# Patient Record
Sex: Female | Born: 1990 | Race: White | Hispanic: No | Marital: Single | Smoking: Never smoker
Health system: Southern US, Community
[De-identification: ages and names within clinical notes are randomized; demographics above are authoritative.]

## PROBLEM LIST (undated history)

## (undated) DIAGNOSIS — Z3490 Encounter for supervision of normal pregnancy, unspecified, unspecified trimester: Secondary | ICD-10-CM

## (undated) DIAGNOSIS — T7840XA Allergy, unspecified, initial encounter: Secondary | ICD-10-CM

## (undated) DIAGNOSIS — K5909 Other constipation: Secondary | ICD-10-CM

## (undated) DIAGNOSIS — K219 Gastro-esophageal reflux disease without esophagitis: Secondary | ICD-10-CM

## (undated) DIAGNOSIS — Z8619 Personal history of other infectious and parasitic diseases: Secondary | ICD-10-CM

## (undated) DIAGNOSIS — K649 Unspecified hemorrhoids: Secondary | ICD-10-CM

## (undated) DIAGNOSIS — I809 Phlebitis and thrombophlebitis of unspecified site: Secondary | ICD-10-CM

## (undated) DIAGNOSIS — R05 Cough: Secondary | ICD-10-CM

## (undated) HISTORY — PX: WISDOM TOOTH EXTRACTION: SHX21

## (undated) HISTORY — DX: Gastro-esophageal reflux disease without esophagitis: K21.9

## (undated) HISTORY — DX: Allergy, unspecified, initial encounter: T78.40XA

## (undated) HISTORY — DX: Phlebitis and thrombophlebitis of unspecified site: I80.9

## (undated) HISTORY — PX: MOUTH SURGERY: SHX715

## (undated) HISTORY — DX: Unspecified hemorrhoids: K64.9

## (undated) HISTORY — DX: Personal history of other infectious and parasitic diseases: Z86.19

## (undated) HISTORY — DX: Other constipation: K59.09

---
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
## 2012-09-08 ENCOUNTER — Ambulatory Visit (INDEPENDENT_AMBULATORY_CARE_PROVIDER_SITE_OTHER): Payer: PRIVATE HEALTH INSURANCE | Admitting: Family Medicine

## 2012-09-08 ENCOUNTER — Encounter: Payer: Self-pay | Admitting: Family Medicine

## 2012-09-08 VITALS — BP 110/60 | HR 98 | Temp 98.5°F | Ht 62.5 in | Wt 132.8 lb

## 2012-09-08 DIAGNOSIS — Z0000 Encounter for general adult medical examination without abnormal findings: Secondary | ICD-10-CM

## 2012-09-08 DIAGNOSIS — Z113 Encounter for screening for infections with a predominantly sexual mode of transmission: Secondary | ICD-10-CM

## 2012-09-08 NOTE — Progress Notes (Signed)
The patient is here for annual wellness exam and preventative care.    Birth control - Not using any birth control at this time.  She felt that trisprintec was difficult to take and patch  Made her depressed. She is not currently interested in birth control.  LMP - 3 wks ago, regular.  4-5d period, not heavy bleeding.  No breakthrough bleeding between periods.  No significant cramping/abd pain.  Takes midol and this works.    Currently sexually active.  3 partners in last year.  Chlamydia 1 year ago. She is interested in STD testing this year.  Condoms 100% time.  Preventative: Seat belt use 100%.  Sunscreen use discussed. Tetanus - 06/2010 prior to trip to [place].    Thinks has had guardasil series. Had chicken pox at age 54 yo.  History   Social History  . Marital Status: Single    Spouse Name: N/A    Number of Children: N/A  . Years of Education: N/A   Social History Main Topics  . Smoking status: Never Smoker   . Smokeless tobacco: Never Used  . Alcohol Use: 0.6 oz/week    1 Glasses of wine per week     Comment: every few weeks  . Drug Use: No  . Sexually Active: Yes    Birth Control/ Protection: Condom   Other Topics Concern  . None   Social History Narrative   Exercising 4 days a week.   Healthy eating.          Review of Systems  Constitutional: Negative for fever, chills, activity change, appetite change, fatigue and unexpected weight change.  HENT: Negative for hearing loss and neck pain.   Eyes: Negative for visual disturbance.  Respiratory: Negative for cough, chest tightness, shortness of breath and wheezing.   Cardiovascular: Negative for chest pain, palpitations and leg swelling.  Gastrointestinal: Negative for nausea, vomiting, abdominal pain, diarrhea, constipation, blood in stool and abdominal distention.  Genitourinary: Negative for hematuria, vaginal bleeding, vaginal discharge and difficulty urinating.  Musculoskeletal: Negative for myalgias  and arthralgias.  Skin: Negative for rash.  Neurological: Negative for dizziness, seizures, syncope and headaches.  Hematological: Does not bruise/bleed easily.  Psychiatric/Behavioral: Negative for dysphoric mood. The patient is not nervous/anxious.          Objective:     Physical Exam  Nursing note and vitals reviewed. Constitutional: She is oriented to person, place, and time. She appears well-developed and well-nourished. No distress.  HENT:   Head: Normocephalic and atraumatic.  Right Ear: External ear normal.  Left Ear: External ear normal.   Nose: Nose normal.   Mouth/Throat: Oropharynx is clear and moist. No oropharyngeal exudate.  Eyes: Conjunctivae and EOM are normal. Pupils are equal, round, and reactive to light. No scleral icterus.  Neck: Normal range of motion. Neck supple. No thyromegaly present.  Cardiovascular: Normal rate, regular rhythm, normal heart sounds and intact distal pulses.    No murmur heard. Pulses:      Radial pulses are 2+ on the right side, and 2+ on the left side.  Pulmonary/Chest: Effort normal and breath sounds normal. No respiratory distress. She has no wheezes. She has no rales.  Abdominal: Soft. Bowel sounds are normal. She exhibits no distension and no mass. There is no tenderness. There is no rebound and no guarding. Hernia confirmed negative in the right inguinal area and confirmed negative in the left inguinal area.  Genitourinary: Uterus normal. There is no rash, tenderness, lesion or  injury on the right labia. There is no rash, tenderness, lesion or injury on the left labia. Cervix exhibits no motion tenderness, no discharge and no friability. Right adnexum displays no mass, no tenderness and no fullness. Left adnexum displays no mass, no tenderness and no fullness. No erythema or tenderness around the vagina. No foreign body around the vagina. No signs of injury around the vagina. Vaginal discharge found.       Pap smear not performed   Musculoskeletal: Normal range of motion.  Lymphadenopathy:    She has no cervical adenopathy.  Neurological: She is alert and oriented to person, place, and time.       CN grossly intact, station and gait intact  Skin: Skin is warm and dry. No rash noted.  Psychiatric: She has a normal mood and affect. Her behavior is normal. Judgment and thought content normal.        Assessment & Plan:      Preventative protocols reviewed and updated. Per pt has had guardasil. UTD tetanus (2012). STD screen today.  Discussed safe sex, self breast exams, as well as performed pelvic today.

## 2012-09-08 NOTE — Assessment & Plan Note (Signed)
The patient's preventative maintenance and recommended screening tests for an annual wellness exam were reviewed in full today. Brought up to date unless services declined.  Counselled on the importance of diet, exercise, and its role in overall health and mortality. The patient's FH and SH was reviewed, including their home life, tobacco status, and drug and alcohol status.    

## 2012-09-08 NOTE — Patient Instructions (Addendum)
Stop at lab on your way out. Keep up great work with exercise and healthy eating.

## 2012-09-09 LAB — GC/CHLAMYDIA PROBE AMP
CT Probe RNA: POSITIVE — AB
GC Probe RNA: NEGATIVE

## 2012-09-09 LAB — HEPATITIS PANEL, ACUTE
HCV Ab: NEGATIVE
Hep A IgM: NEGATIVE
Hepatitis B Surface Ag: NEGATIVE

## 2012-09-09 LAB — RPR

## 2012-09-14 MED ORDER — DOXYCYCLINE HYCLATE 100 MG PO TABS: 100.0000 mg | ORAL_TABLET | Freq: Two times a day (BID) | ORAL | Status: DC

## 2012-09-14 NOTE — Addendum Note (Signed)
Addended by: Consuello Masse on: 09/14/2012 08:07 AM   Modules accepted: Orders

## 2012-09-24 ENCOUNTER — Telehealth: Payer: Self-pay

## 2012-09-24 NOTE — Telephone Encounter (Signed)
Pt said when taking Doxycycline without food made her nauseated but when she took with food did not cause nausea. Pt has already finished med. Pt wants to know if one prescription of Doxycycline will take care of Chlamydia. Pt does not have any discharge or itching.Please advise. Pt request call back by doctor if possible. [facility].

## 2012-09-25 NOTE — Telephone Encounter (Signed)
Patient advised.

## 2012-09-25 NOTE — Telephone Encounter (Signed)
Yes the doxy will treat chlamydia with 10 day course as long as she did not have sex with infected individaul duing course of treatment.

## 2012-10-13 ENCOUNTER — Encounter: Payer: Self-pay | Admitting: Family Medicine

## 2012-10-13 ENCOUNTER — Ambulatory Visit (INDEPENDENT_AMBULATORY_CARE_PROVIDER_SITE_OTHER): Payer: PRIVATE HEALTH INSURANCE | Admitting: Family Medicine

## 2012-10-13 VITALS — BP 100/60 | HR 87 | Temp 98.2°F | Ht 62.5 in | Wt 130.5 lb

## 2012-10-13 DIAGNOSIS — A568 Sexually transmitted chlamydial infection of other sites: Secondary | ICD-10-CM

## 2012-10-13 DIAGNOSIS — Z3009 Encounter for other general counseling and advice on contraception: Secondary | ICD-10-CM

## 2012-10-13 NOTE — Patient Instructions (Addendum)
Return for UA and depo injection in 2 weeks.  We will call with UA and lab results.

## 2012-10-13 NOTE — Addendum Note (Signed)
Addended by: Consuello Masse on: 10/13/2012 12:12 PM   Modules accepted: Orders

## 2012-10-13 NOTE — Assessment & Plan Note (Addendum)
Reviewed options ,SE and answered pt questions. Chose  Depo Provera

## 2012-10-13 NOTE — Progress Notes (Signed)
  Subjective:    Patient ID: Julia Lyons, female    DOB: 09/19/1990, 22 y.o.   MRN: [id number]  HPI  22 year old female recently seen for CPX with STD screen found to be positive for chlamydia presents for follow up. She was treated with 10 day course of doxycycline and took all of this except the first day.. She threw up one dose. She has also had a different partner lately and condom broke in last 3 weeks. Took the  "day after pill" She did not tell any of her partners that she had been diagnosed.  She wishes to be retested for chlamydia to ensure resolution of infection.   She is interested in birth control options. She has tried patch and OCPs. She felt depressed on patch.  Last menses 2-3 weeks ago.     Review of Systems  Constitutional: Negative for fever and fatigue.  HENT: Negative for ear pain.   Eyes: Negative for pain.  Respiratory: Negative for chest tightness and shortness of breath.   Cardiovascular: Negative for chest pain, palpitations and leg swelling.  Gastrointestinal: Negative for abdominal pain.  Genitourinary: Negative for dysuria, vaginal bleeding and vaginal discharge.       Objective:   Physical Exam  Constitutional: She appears well-developed and well-nourished.  Cardiovascular: Normal rate and regular rhythm.  Exam reveals no friction rub.   No murmur heard. Pulmonary/Chest: Effort normal and breath sounds normal. No respiratory distress. She has no wheezes. She has no rales. She exhibits no tenderness.  Abdominal: Soft. There is no tenderness. There is no rebound and no guarding.  Genitourinary: Uterus normal. There is no rash or lesion on the right labia. There is no rash or lesion on the left labia. Cervix exhibits no motion tenderness, no discharge and no friability. No tenderness or bleeding around the vagina. Vaginal discharge found.          Assessment & Plan:
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
## 2012-10-14 LAB — GC/CHLAMYDIA PROBE AMP: GC Probe RNA: NEGATIVE

## 2012-10-27 ENCOUNTER — Ambulatory Visit: Payer: PRIVATE HEALTH INSURANCE | Admitting: Family Medicine

## 2012-10-28 ENCOUNTER — Ambulatory Visit (INDEPENDENT_AMBULATORY_CARE_PROVIDER_SITE_OTHER): Payer: PRIVATE HEALTH INSURANCE | Admitting: *Deleted

## 2012-10-28 DIAGNOSIS — Z309 Encounter for contraceptive management, unspecified: Secondary | ICD-10-CM

## 2012-10-28 LAB — POCT URINE PREGNANCY: Preg Test, Ur: NEGATIVE

## 2012-10-28 MED ORDER — MEDROXYPROGESTERONE ACETATE 150 MG/ML IM SUSP
150.0000 mg | Freq: Once | INTRAMUSCULAR | Status: AC
  Administered 2012-10-28: 150 mg via INTRAMUSCULAR

## 2013-05-27 NOTE — L&D Delivery Note (Signed)
Delivery Note At 7:40 PM a viable female "Julia Lyons" was delivered via [place] (Presentation: Occiput Anterior).  APGARS: 9, 9; weight 8lbs 1 oz (3668 g).   Placenta status: Intact, Spontaneous.  Cord: 3 vessels with the following complications: None.  Cord pH: NA  Anesthesia: Local for repair Episiotomy: None Lacerations: Bilateral labial lacs Suture Repair: 3.0 vicryl SH Est. Blood Loss (mL): 250  Mom to postpartum.  Baby to Couplet care / Skin to Skin.  Mom plans to breastfeed.  Undecided re: birth control.  Windy Kalata, CNM present for birth.  Farrel Gordon 05/23/2014, 8:48 PM

## 2013-09-29 LAB — OB RESULTS CONSOLE ABO/RH: RH Type: POSITIVE

## 2013-09-29 LAB — OB RESULTS CONSOLE GC/CHLAMYDIA
CHLAMYDIA, DNA PROBE: NEGATIVE
GC PROBE AMP, GENITAL: NEGATIVE

## 2013-09-29 LAB — OB RESULTS CONSOLE RUBELLA ANTIBODY, IGM: Rubella: IMMUNE

## 2013-09-29 LAB — OB RESULTS CONSOLE ANTIBODY SCREEN: Antibody Screen: NEGATIVE

## 2013-09-29 LAB — OB RESULTS CONSOLE HEPATITIS B SURFACE ANTIGEN: Hepatitis B Surface Ag: NEGATIVE

## 2013-09-29 LAB — OB RESULTS CONSOLE HIV ANTIBODY (ROUTINE TESTING): HIV: NONREACTIVE

## 2013-09-29 LAB — OB RESULTS CONSOLE RPR: RPR: NONREACTIVE

## 2014-04-19 LAB — OB RESULTS CONSOLE GBS: GBS: POSITIVE

## 2014-05-19 ENCOUNTER — Emergency Department (HOSPITAL_COMMUNITY): Payer: PRIVATE HEALTH INSURANCE

## 2014-05-19 ENCOUNTER — Encounter (HOSPITAL_COMMUNITY): Payer: Self-pay

## 2014-05-19 ENCOUNTER — Observation Stay (HOSPITAL_COMMUNITY)
Admission: EM | Admit: 2014-05-19 | Discharge: 2014-05-20 | Disposition: A | Discharge status: Home / Self Care | Payer: PRIVATE HEALTH INSURANCE | Attending: Obstetrics and Gynecology | Admitting: Obstetrics and Gynecology

## 2014-05-19 DIAGNOSIS — K219 Gastro-esophageal reflux disease without esophagitis: Secondary | ICD-10-CM | POA: Diagnosis not present

## 2014-05-19 DIAGNOSIS — O26893 Other specified pregnancy related conditions, third trimester: Principal | ICD-10-CM | POA: Insufficient documentation

## 2014-05-19 DIAGNOSIS — Y92410 Unspecified street and highway as the place of occurrence of the external cause: Secondary | ICD-10-CM | POA: Diagnosis not present

## 2014-05-19 DIAGNOSIS — J45990 Exercise induced bronchospasm: Secondary | ICD-10-CM | POA: Diagnosis not present

## 2014-05-19 DIAGNOSIS — R102 Pelvic and perineal pain: Secondary | ICD-10-CM | POA: Insufficient documentation

## 2014-05-19 DIAGNOSIS — Z3A40 40 weeks gestation of pregnancy: Secondary | ICD-10-CM | POA: Insufficient documentation

## 2014-05-19 MED ORDER — CALCIUM CARBONATE ANTACID 500 MG PO CHEW: 2.0000 | CHEWABLE_TABLET | ORAL | Status: DC | PRN

## 2014-05-19 MED ORDER — PRENATAL MULTIVITAMIN CH: 1.0000 | ORAL_TABLET | Freq: Every day | ORAL | Status: DC

## 2014-05-19 MED ORDER — ACETAMINOPHEN 325 MG PO TABS
650.0000 mg | ORAL_TABLET | ORAL | Status: DC | PRN
  Administered 2014-05-19 – 2014-05-20 (×2): 650 mg via ORAL
  Filled 2014-05-19 (×2): qty 2

## 2014-05-19 MED ORDER — DOCUSATE SODIUM 100 MG PO CAPS: 100.0000 mg | ORAL_CAPSULE | Freq: Every day | ORAL | Status: DC

## 2014-05-19 MED ORDER — ZOLPIDEM TARTRATE 5 MG PO TABS: 5.0000 mg | ORAL_TABLET | Freq: Every evening | ORAL | Status: DC | PRN

## 2014-05-19 NOTE — ED Notes (Signed)
OB rapid response nurse at bedside and monitoring patient and baby
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
## 2014-05-20 DIAGNOSIS — O26893 Other specified pregnancy related conditions, third trimester: Secondary | ICD-10-CM | POA: Diagnosis not present

## 2014-05-20 LAB — CBC
HCT: 35.1 % — ABNORMAL LOW (ref 36.0–46.0)
Hemoglobin: 11.6 g/dL — ABNORMAL LOW (ref 12.0–15.0)
MCH: 27.6 pg (ref 26.0–34.0)
MCHC: 33 g/dL (ref 30.0–36.0)
MCV: 83.4 fL (ref 78.0–100.0)
Platelets: 293 10*3/uL (ref 150–400)
RBC: 4.21 MIL/uL (ref 3.87–5.11)
RDW: 14.5 % (ref 11.5–15.5)
WBC: 15.3 10*3/uL — ABNORMAL HIGH (ref 4.0–10.5)

## 2014-05-20 LAB — ABO/RH: ABO/RH(D): O POS

## 2014-05-20 LAB — TYPE AND SCREEN
ABO/RH(D): O POS
Antibody Screen: NEGATIVE

## 2014-05-20 LAB — HIV ANTIBODY (ROUTINE TESTING W REFLEX): HIV: NONREACTIVE

## 2014-05-20 LAB — RPR

## 2014-05-20 MED ORDER — LACTATED RINGERS IV SOLN
INTRAVENOUS | Status: DC
  Administered 2014-05-20: 03:00:00 via INTRAVENOUS

## 2014-05-20 MED ORDER — LACTATED RINGERS IV BOLUS (SEPSIS)
500.0000 mL | Freq: Once | INTRAVENOUS | Status: AC
  Administered 2014-05-20: 500 mL via INTRAVENOUS

## 2014-05-20 NOTE — Discharge Instructions (Signed)
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
## 2014-05-23 ENCOUNTER — Inpatient Hospital Stay (HOSPITAL_COMMUNITY)
Admission: AD | Admit: 2014-05-23 | Discharge: 2014-05-25 | DRG: 775 | Disposition: A | Discharge status: Home / Self Care | Payer: Medicaid Other | Source: Ambulatory Visit | Attending: Obstetrics & Gynecology | Admitting: Obstetrics & Gynecology

## 2014-05-23 ENCOUNTER — Encounter (HOSPITAL_COMMUNITY): Payer: Self-pay | Admitting: *Deleted

## 2014-05-23 DIAGNOSIS — Z3A40 40 weeks gestation of pregnancy: Secondary | ICD-10-CM | POA: Diagnosis present

## 2014-05-23 DIAGNOSIS — K219 Gastro-esophageal reflux disease without esophagitis: Secondary | ICD-10-CM | POA: Diagnosis present

## 2014-05-23 DIAGNOSIS — O99824 Streptococcus B carrier state complicating childbirth: Principal | ICD-10-CM | POA: Diagnosis present

## 2014-05-23 DIAGNOSIS — Z3400 Encounter for supervision of normal first pregnancy, unspecified trimester: Secondary | ICD-10-CM | POA: Diagnosis present

## 2014-05-23 DIAGNOSIS — J45909 Unspecified asthma, uncomplicated: Secondary | ICD-10-CM | POA: Diagnosis present

## 2014-05-23 DIAGNOSIS — B951 Streptococcus, group B, as the cause of diseases classified elsewhere: Secondary | ICD-10-CM | POA: Diagnosis present

## 2014-05-23 HISTORY — DX: Cough: R05

## 2014-05-23 LAB — CBC
HEMATOCRIT: 37 % (ref 36.0–46.0)
Hemoglobin: 12.2 g/dL (ref 12.0–15.0)
MCH: 27.1 pg (ref 26.0–34.0)
MCHC: 33 g/dL (ref 30.0–36.0)
MCV: 82 fL (ref 78.0–100.0)
PLATELETS: 318 10*3/uL (ref 150–400)
RBC: 4.51 MIL/uL (ref 3.87–5.11)
RDW: 15.2 % (ref 11.5–15.5)
WBC: 14.2 10*3/uL — ABNORMAL HIGH (ref 4.0–10.5)

## 2014-05-23 LAB — TYPE AND SCREEN
ABO/RH(D): O POS
ANTIBODY SCREEN: NEGATIVE

## 2014-05-23 MED ORDER — LACTATED RINGERS IV SOLN: 500.0000 mL | INTRAVENOUS | Status: DC | PRN

## 2014-05-23 MED ORDER — ONDANSETRON HCL 4 MG PO TABS
4.0000 mg | ORAL_TABLET | ORAL | Status: DC | PRN
Start: 2014-05-23 — End: 2014-05-25

## 2014-05-23 MED ORDER — ACETAMINOPHEN 325 MG PO TABS: 650.0000 mg | ORAL_TABLET | ORAL | Status: DC | PRN

## 2014-05-23 MED ORDER — ONDANSETRON HCL 4 MG/2ML IJ SOLN: 4.0000 mg | Freq: Four times a day (QID) | INTRAMUSCULAR | Status: DC | PRN

## 2014-05-23 MED ORDER — LIDOCAINE HCL (PF) 1 % IJ SOLN
30.0000 mL | INTRAMUSCULAR | Status: AC | PRN
  Administered 2014-05-23: 30 mL via SUBCUTANEOUS
  Filled 2014-05-23: qty 30

## 2014-05-23 MED ORDER — IBUPROFEN 600 MG PO TABS
600.0000 mg | ORAL_TABLET | Freq: Four times a day (QID) | ORAL | Status: DC
  Administered 2014-05-23 – 2014-05-25 (×8): 600 mg via ORAL
  Filled 2014-05-23 (×8): qty 1

## 2014-05-23 MED ORDER — WITCH HAZEL-GLYCERIN EX PADS: 1.0000 "application " | MEDICATED_PAD | CUTANEOUS | Status: DC | PRN

## 2014-05-23 MED ORDER — OXYCODONE-ACETAMINOPHEN 5-325 MG PO TABS: 1.0000 | ORAL_TABLET | ORAL | Status: DC | PRN

## 2014-05-23 MED ORDER — SIMETHICONE 80 MG PO CHEW: 80.0000 mg | CHEWABLE_TABLET | ORAL | Status: DC | PRN

## 2014-05-23 MED ORDER — OXYTOCIN BOLUS FROM INFUSION
500.0000 mL | INTRAVENOUS | Status: DC
  Administered 2014-05-23: 500 mL via INTRAVENOUS

## 2014-05-23 MED ORDER — PENICILLIN G POTASSIUM 5000000 UNITS IJ SOLR
2.5000 10*6.[IU] | INTRAVENOUS | Status: DC
  Filled 2014-05-23 (×3): qty 2.5

## 2014-05-23 MED ORDER — CITRIC ACID-SODIUM CITRATE 334-500 MG/5ML PO SOLN: 30.0000 mL | ORAL | Status: DC | PRN

## 2014-05-23 MED ORDER — DIPHENHYDRAMINE HCL 25 MG PO CAPS: 25.0000 mg | ORAL_CAPSULE | Freq: Four times a day (QID) | ORAL | Status: DC | PRN

## 2014-05-23 MED ORDER — LANOLIN HYDROUS EX OINT: TOPICAL_OINTMENT | CUTANEOUS | Status: DC | PRN

## 2014-05-23 MED ORDER — ONDANSETRON HCL 4 MG/2ML IJ SOLN: 4.0000 mg | INTRAMUSCULAR | Status: DC | PRN

## 2014-05-23 MED ORDER — ZOLPIDEM TARTRATE 5 MG PO TABS: 5.0000 mg | ORAL_TABLET | Freq: Every evening | ORAL | Status: DC | PRN

## 2014-05-23 MED ORDER — DIBUCAINE 1 % RE OINT: 1.0000 "application " | TOPICAL_OINTMENT | RECTAL | Status: DC | PRN

## 2014-05-23 MED ORDER — OXYTOCIN 40 UNITS IN LACTATED RINGERS INFUSION - SIMPLE MED
62.5000 mL/h | INTRAVENOUS | Status: DC
  Filled 2014-05-23: qty 1000

## 2014-05-23 MED ORDER — LACTATED RINGERS IV SOLN
INTRAVENOUS | Status: DC
Start: 2014-05-23 — End: 2014-05-23
  Administered 2014-05-23: 16:00:00 via INTRAVENOUS

## 2014-05-23 MED ORDER — FLEET ENEMA 7-19 GM/118ML RE ENEM: 1.0000 | ENEMA | RECTAL | Status: DC | PRN

## 2014-05-23 MED ORDER — PENICILLIN G POTASSIUM 5000000 UNITS IJ SOLR
5.0000 10*6.[IU] | Freq: Once | INTRAVENOUS | Status: AC
  Administered 2014-05-23: 5 10*6.[IU] via INTRAVENOUS
  Filled 2014-05-23: qty 5

## 2014-05-23 MED ORDER — TETANUS-DIPHTH-ACELL PERTUSSIS 5-2.5-18.5 LF-MCG/0.5 IM SUSP: 0.5000 mL | Freq: Once | INTRAMUSCULAR | Status: DC

## 2014-05-23 MED ORDER — PRENATAL MULTIVITAMIN CH
1.0000 | ORAL_TABLET | Freq: Every day | ORAL | Status: DC
  Filled 2014-05-23 (×2): qty 1

## 2014-05-23 MED ORDER — LACTATED RINGERS IV SOLN: INTRAVENOUS | Status: DC

## 2014-05-23 MED ORDER — OXYCODONE-ACETAMINOPHEN 5-325 MG PO TABS: 2.0000 | ORAL_TABLET | ORAL | Status: DC | PRN

## 2014-05-23 MED ORDER — BENZOCAINE-MENTHOL 20-0.5 % EX AERO
1.0000 "application " | INHALATION_SPRAY | CUTANEOUS | Status: DC | PRN
  Administered 2014-05-24: 1 via TOPICAL
  Filled 2014-05-23: qty 56

## 2014-05-23 MED ORDER — SENNOSIDES-DOCUSATE SODIUM 8.6-50 MG PO TABS
2.0000 | ORAL_TABLET | ORAL | Status: DC
  Administered 2014-05-23 – 2014-05-25 (×2): 2 via ORAL
  Filled 2014-05-23 (×2): qty 2

## 2014-05-23 MED ORDER — BUTORPHANOL TARTRATE 1 MG/ML IJ SOLN: 1.0000 mg | INTRAMUSCULAR | Status: DC | PRN

## 2014-05-23 MED ORDER — OXYCODONE-ACETAMINOPHEN 5-325 MG PO TABS
2.0000 | ORAL_TABLET | ORAL | Status: DC | PRN
Start: 2014-05-23 — End: 2014-05-23

## 2014-05-23 NOTE — Progress Notes (Signed)
  Subjective: Assumed care of pt. Breathing through ctxs. Family and doula at bedside. Tub not yet set up.  Objective: BP 119/78 mmHg  Pulse 87  Temp(Src) 98.3 F (36.8 C) (Oral)  Resp 18  Ht 5\' 1"  (1.549 m)  Wt 141 lb (63.957 kg)  BMI 26.66 kg/m2     FHT: Category 1 UC:   irregular, every 4 minutes SVE: Deferred  Assessment:  IUP at 40.3 wks Early labor WB candidate GBS positive  Plan: Expectant management Continue GBS prophylaxis Consult prn V. Cira Servant, CNM proctor until 8:00 PM. Will contact [facility] on call CNM after 8:00 PM to apprise of pt's status and for needed proctor coverage  Farrel Gordon CNM 05/23/2014, 6:13 PM

## 2014-05-23 NOTE — Progress Notes (Signed)
Waterbirth consent in chart
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
## 2014-05-23 NOTE — Progress Notes (Signed)
Late entry progress note.  Pt began feeling pressure around 6:30 pm and opted to get in tub. While in tub, reassuring FHR with ctxs q 1-2 min. Cvx C/C/+2. Windy Kalata, CNM notified and remained in hospital for imminent delivery.  Farrel Gordon, CNM 05/23/14, 8:58 PM

## 2014-05-23 NOTE — MAU Note (Signed)
Regular since 040000, now every 5 minx .scant amt of bleed. No leaking. No problems with preg.

## 2014-05-23 NOTE — Progress Notes (Signed)
Encouraged pt multiple times to get up to walk, sit on birthing ball, or any activity she felt would be helpful with her pain. Pt remains in the bed.

## 2014-05-24 LAB — CBC
HEMATOCRIT: 33.1 % — AB (ref 36.0–46.0)
HEMOGLOBIN: 10.8 g/dL — AB (ref 12.0–15.0)
MCH: 27.2 pg (ref 26.0–34.0)
MCHC: 32.6 g/dL (ref 30.0–36.0)
MCV: 83.4 fL (ref 78.0–100.0)
Platelets: 279 10*3/uL (ref 150–400)
RBC: 3.97 MIL/uL (ref 3.87–5.11)
RDW: 14.9 % (ref 11.5–15.5)
WBC: 19.5 10*3/uL — AB (ref 4.0–10.5)

## 2014-05-24 LAB — RPR

## 2014-05-24 NOTE — Progress Notes (Signed)
Julia Lyons  Post Partum Day 1:S/P SVD in water with Bilateral Labial Lacerations  Subjective: Patient up ad lib, denies syncope or dizziness. Reports consuming regular diet without issues and denies N/V. Denies issues with urination, but admits to burning and reports bleeding is "stable, but consistent."  Patient is breastfeeding and reports going well.  Unsure of postpartum contraception method.  Managing pain with ibuprofen.   Objective: Filed Vitals:   05/23/14 2356 05/24/14 0333 05/24/14 0626 05/24/14 0846  BP: 100/64 106/65 113/73 106/67  Pulse: 96 86 87 76  Temp: 98.3 F (36.8 C) 98 F (36.7 C) 98.6 F (37 C) 98.3 F (36.8 C)  TempSrc: Oral Oral Oral Oral  Resp: 18 18 18 18   Height:      Weight:      SpO2:    98%    Recent Labs  05/23/14 1610 05/24/14 0610  HGB 12.2 10.8*  HCT 37.0 33.1*    Physical Exam:  General appearance: alert, cooperative and no distress Abdomen: normal findings: soft, non-tender Pulses: 2+ and symmetric Skin: Skin color, texture, turgor normal. No rashes or lesions Lochia: Small Laceration: N/A due to location Uterine Fundus: firm at U/-1 DVT Evaluation: No evidence of DVT seen on physical exam. No cords or calf tenderness.  Assessment S/P Vaginal Delivery-Day 1 Normal [employer]olution Breastfeeding Unsure of BCM Options  Plan:  Discussed various BCM that are safe during breastfeeding Will provide additional information at discharge Continue current care as ordered Dr.A. Mancel Bale to be updated on patient status  Plan for discharge tomorrow  Julia Lyons, Julia Found, MSN, CNM 05/24/2014, 1:41 PM

## 2014-05-24 NOTE — Lactation Note (Signed)
This note was copied from the chart of Julia Lyons. Lactation Consultation Note Initial visit at 28 hours of age.  Mom reports baby is doing well with feedings.  Baby is held in blankets now and showing feeding cues.  Encouraged mom to have visitors to step out so she can feed baby on demand.  Assisted with STS in cross cradle hold.  Mom reports she took a breastfeeding class with WIC.  Minimal assistance needed to position baby and latch.  Baby has wide open mouth, encouraged mom to compress breast to assist with latch.  Baby has wide flanged lips with rhythmic sucking and few swallows.  [facility] LC resources given and discussed.  Encouraged to feed with early cues on demand.  Early newborn behavior discussed.  Hand expression demonstrated with colostrum visible.  Mom to call for assist as needed.    Patient Name: Julia Lyons ALPFX'T Date: 05/24/2014 Reason for consult: Initial assessment   Maternal Data Has patient been taught Hand Expression?: Yes Does the patient have breastfeeding experience prior to this delivery?: No  Feeding Feeding Type: Breast Fed Length of feed:  (observed several minutes)  LATCH Score/Interventions Latch: Grasps breast easily, tongue down, lips flanged, rhythmical sucking.  Audible Swallowing: A few with stimulation  Type of Nipple: Everted at rest and after stimulation  Comfort (Breast/Nipple): Soft / non-tender     Hold (Positioning): Assistance needed to correctly position infant at breast and maintain latch. Intervention(s): Breastfeeding basics reviewed;Support Pillows;Position options;Skin to skin  LATCH Score: 8  Lactation Tools Discussed/Used     Consult Status Consult Status: Follow-up Date: 05/25/14 Follow-up type: In-patient    Justice Britain 05/24/2014, 5:47 PM

## 2014-05-25 MED ORDER — DIBUCAINE 1 % RE OINT: 1.0000 "application " | TOPICAL_OINTMENT | RECTAL | Status: DC | PRN

## 2014-05-25 MED ORDER — BENZOCAINE-MENTHOL 20-0.5 % EX AERO: 1.0000 "application " | INHALATION_SPRAY | CUTANEOUS | Status: DC | PRN

## 2014-05-25 MED ORDER — SENNOSIDES-DOCUSATE SODIUM 8.6-50 MG PO TABS: 2.0000 | ORAL_TABLET | ORAL | Status: DC

## 2014-05-25 MED ORDER — IBUPROFEN 600 MG PO TABS: 600.0000 mg | ORAL_TABLET | Freq: Four times a day (QID) | ORAL | Status: DC

## 2014-05-25 MED ORDER — WITCH HAZEL-GLYCERIN EX PADS: 1.0000 "application " | MEDICATED_PAD | CUTANEOUS | Status: DC | PRN

## 2014-05-25 MED ORDER — FERROUS SULFATE 325 (65 FE) MG PO TBEC: 325.0000 mg | DELAYED_RELEASE_TABLET | Freq: Two times a day (BID) | ORAL | Status: DC

## 2014-05-25 MED ORDER — OXYCODONE-ACETAMINOPHEN 5-325 MG PO TABS
1.0000 | ORAL_TABLET | ORAL | Status: DC | PRN
Start: 2014-05-25 — End: 2014-06-29

## 2014-05-25 NOTE — Discharge Summary (Signed)
Vaginal Delivery Discharge Summary  ALL information will be verified prior to discharge  Julia Lyons  DOB:    Sep 28, 1990 MRN:    [id number] CSN:    [id number]  Date of admission:                  05/23/14  Date of discharge:                   05/25/14  Procedures this admission: Ronald  Date of Delivery: 05/23/14  Newborn Data:  Live born  Information for the patient's newborn:  Mattingly, [place] Girl Rozann [[id number]]  female  APGAR ,   Live born female  Birth Weight: 8 lb 1.4 oz (3668 g) APGAR: 9, 9  Weight    Home with mother. Name: Hadynn  History of Present Illness: Julia Lyons is a 23 y.o. female, G1P1001, who presents at [redacted]w[redacted]d weeks gestation. The patient has been followed at the [facility] of [facility]. She was admitted onset of labor. Her pregnancy has been complicated by:  Patient Active Problem List   Diagnosis Date Noted  . Positive GBS test 05/23/2014  . NSVD (normal spontaneous vaginal delivery) - Successful waterbirth 05/23/2014  . Obstetric labial laceration, delivered, current hospitalization 05/23/2014  . Chlamydia trachomatis infection 10/13/2012  . Exercise-induced asthma 10/22/2011    Hospital course: The patient was admitted for labor.   Her labor was not complicated. She proceeded to have a vaginal delivery of a healthy infant. Her delivery was not complicated. Her postpartum course was not complicated. She was discharged to home on postpartum day 2 doing well.  Feeding: breast  Contraception: unsure Pt understands the risks birth control are not limited to irregular bleeding, formation of DVT, fluid fluctuations, elevation in blood pressure, stroke, breast tenderness and liver damage.  She states she will report any serious side effects.  She has been given verbal and written instructions and voiced a clear understanding.    Discharge hemoglobin: HEMOGLOBIN    Date Value Ref Range Status  05/24/2014 10.8* 12.0 - 15.0 g/dL Final   HCT  Date Value Ref Range Status  05/24/2014 33.1* 36.0 - 46.0 % Final    PreNatal Labs ABO, Rh: --/--/O POS (12/28 1610)   Antibody: NEG (12/28 1610) Rubella:   immune RPR: NON REAC (12/28 1610)  HBsAg: Negative (05/06 0000)  HIV: NONREACTIVE (12/25 0040)  GBS: Positive (11/24 0000)  Discharge Physical Exam:  General: alert and cooperative Lochia: appropriate Uterine Fundus: firm Incision: healing well, no significant drainage DVT Evaluation: No evidence of DVT seen on physical exam.  Intrapartum Procedures: spontaneous vaginal delivery and GBS prophylaxis Postpartum Procedures: none Complications-Operative and Postpartum: Bilateral labial  degree perineal laceration  Discharge Diagnoses: Term Pregnancy-delivered  Activity:           pelvic rest Diet:                routine Medications: PNV, Ibuprofen, Iron, Percocet and Senokot Condition:      stable     Postpartum Teaching: Nutrition, exercise, return to work or school, family visits, sexual activity, home rest, vaginal bleeding, pelvic rest, family planning, s/s of PPD, breast care peri-care and incision care   Discharge to: home  Follow-up Information    Follow up with [facility]. Schedule an appointment as soon as possible for a visit in 6 weeks.   Specialty:  Obstetrics and Gynecology   Why:  Postpartum  check up, Call with any questions or concerns   Contact information:   [address] [place] [address] [phone]       Corean Yoshimura, CNM, MSN 05/25/2014. 7:38 AM   Postpartum Care After Vaginal Delivery  After you deliver your newborn (postpartum period), the usual stay in the hospital is 24 72 hours. If there were problems with your labor or delivery, or if you have other medical problems, you might be in the hospital longer.  While you are in the hospital,  you will receive help and instructions on how to care for yourself and your newborn during the postpartum period.  While you are in the hospital:  Be sure to tell your nurses if you have pain or discomfort, as well as where you feel the pain and what makes the pain worse.  If you had an incision made near your vagina (episiotomy) or if you had some tearing during delivery, the nurses may put ice packs on your episiotomy or tear. The ice packs may help to reduce the pain and swelling.  If you are breastfeeding, you may feel uncomfortable contractions of your uterus for a couple of weeks. This is normal. The contractions help your uterus get back to normal size.  It is normal to have some bleeding after delivery.  For the first 1 3 days after delivery, the flow is red and the amount may be similar to a period.  It is common for the flow to start and stop.  In the first few days, you may pass some small clots. Let your nurses know if you begin to pass large clots or your flow increases.  Do not  flush blood clots down the toilet before having the nurse look at them.  During the next 3 10 days after delivery, your flow should become more watery and pink or brown-tinged in color.  Ten to fourteen days after delivery, your flow should be a small amount of yellowish-white discharge.  The amount of your flow will decrease over the first few weeks after delivery. Your flow may stop in 6 8 weeks. Most women have had their flow stop by 12 weeks after delivery.  You should change your sanitary pads frequently.  Wash your hands thoroughly with soap and water for at least 20 seconds after changing pads, using the toilet, or before holding or feeding your newborn.  You should feel like you need to empty your bladder within the first 6 8 hours after delivery.  In case you become weak, lightheaded, or faint, call your nurse before you get out of bed for the first time and before you take a shower for  the first time.  Within the first few days after delivery, your breasts may begin to feel tender and full. This is called engorgement. Breast tenderness usually goes away within 48 72 hours after engorgement occurs. You may also notice milk leaking from your breasts. If you are not breastfeeding, do not stimulate your breasts. Breast stimulation can make your breasts produce more milk.  Spending as much time as possible with your newborn is very important. During this time, you and your newborn can feel close and get to know each other. Having your newborn stay in your room (rooming in) will help to strengthen the bond with your newborn. It will give you time to get to know your newborn and become comfortable caring for your newborn.  Your hormones change after delivery. Sometimes the hormone  changes can temporarily cause you to feel sad or tearful. These feelings should not last more than a few days. If these feelings last longer than that, you should talk to your caregiver.  If desired, talk to your caregiver about methods of family planning or contraception.  Talk to your caregiver about immunizations. Your caregiver may want you to have the following immunizations before leaving the hospital:  Tetanus, diphtheria, and pertussis (Tdap) or tetanus and diphtheria (Td) immunization. It is very important that you and your family (including grandparents) or others caring for your newborn are up-to-date with the Tdap or Td immunizations. The Tdap or Td immunization can help protect your newborn from getting ill.  Rubella immunization.  Varicella (chickenpox) immunization.  Influenza immunization. You should receive this annual immunization if you did not receive the immunization during your pregnancy. Document Released: 03/10/2007 Document Revised: 02/05/2012 Document Reviewed: 01/08/2012 [facility] Patient Information 2014 [place].   Postpartum Depression and Baby Blues  The postpartum  period begins right after the birth of a baby. During this time, there is often a great amount of joy and excitement. It is also a time of considerable changes in the life of the parent(s). Regardless of how many times a mother gives birth, each child brings new challenges and dynamics to the family. It is not unusual to have feelings of excitement accompanied by confusing shifts in moods, emotions, and thoughts. All mothers are at risk of developing postpartum depression or the "baby blues." These mood changes can occur right after giving birth, or they may occur many months after giving birth. The baby blues or postpartum depression can be mild or severe. Additionally, postpartum depression can resolve rather quickly, or it can be a long-term condition. CAUSES Elevated hormones and their rapid decline are thought to be a main cause of postpartum depression and the baby blues. There are a number of hormones that radically change during and after pregnancy. Estrogen and progesterone usually decrease immediately after delivering your baby. The level of thyroid hormone and various cortisol steroids also rapidly drop. Other factors that play a major role in these changes include major life events and genetics.  RISK FACTORS If you have any of the following risks for the baby blues or postpartum depression, know what symptoms to watch out for during the postpartum period. Risk factors that may increase the likelihood of getting the baby blues or postpartum depression include: 1. Havinga personal or family history of depression. 2. Having depression while being pregnant. 3. Having premenstrual or oral contraceptive-associated mood issues. 4. Having exceptional life stress. 5. Having marital conflict. 6. Lacking a social support network. 7. Having a baby with special needs. 8. Having health problems such as diabetes. SYMPTOMS Baby blues symptoms include:  Brief fluctuations in mood, such as going from  extreme happiness to sadness.  Decreased concentration.  Difficulty sleeping.  Crying spells, tearfulness.  Irritability.  Anxiety. Postpartum depression symptoms typically begin within the first month after giving birth. These symptoms include:  Difficulty sleeping or excessive sleepiness.  Marked weight loss.  Agitation.  Feelings of worthlessness.  Lack of interest in activity or food. Postpartum psychosis is a very concerning condition and can be dangerous. Fortunately, it is rare. Displaying any of the following symptoms is cause for immediate medical attention. Postpartum psychosis symptoms include:  Hallucinations and delusions.  Bizarre or disorganized behavior.  Confusion or disorientation. DIAGNOSIS  A diagnosis is made by an evaluation of your symptoms. There are no medical or  lab tests that lead to a diagnosis, but there are various questionnaires that a caregiver may use to identify those with the baby blues, postpartum depression, or psychosis. Often times, a screening tool called the [place] Postnatal Depression Scale is used to diagnose depression in the postpartum period.  TREATMENT The baby blues usually goes away on its own in 1 to 2 weeks. Social support is often all that is needed. You should be encouraged to get adequate sleep and rest. Occasionally, you may be given medicines to help you sleep.  Postpartum depression requires treatment as it can last several months or longer if it is not treated. Treatment may include individual or group therapy, medicine, or both to address any social, physiological, and psychological factors that may play a role in the depression. Regular exercise, a healthy diet, rest, and social support may also be strongly recommended.  Postpartum psychosis is more serious and needs treatment right away. Hospitalization is often needed. HOME CARE INSTRUCTIONS  Get as much rest as you can. Nap when the baby sleeps.  Exercise  regularly. Some women find yoga and walking to be beneficial.  Eat a balanced and nourishing diet.  Do little things that you enjoy. Have a cup of tea, take a bubble bath, read your favorite magazine, or listen to your favorite music.  Avoid alcohol.  Ask for help with household chores, cooking, grocery shopping, or running errands as needed. Do not try to do everything.  Talk to people close to you about how you are feeling. Get support from your partner, family members, friends, or other new moms.  Try to stay positive in how you think. Think about the things you are grateful for.  Do not spend a lot of time alone.  Only take medicine as directed by your caregiver.  Keep all your postpartum appointments.  Let your caregiver know if you have any concerns. SEEK MEDICAL CARE IF: You are having a reaction or problems with your medicine. SEEK IMMEDIATE MEDICAL CARE IF:  You have suicidal feelings.  You feel you may harm the baby or someone else. Document Released: 02/15/2004 Document Revised: 08/05/2011 Document Reviewed: 03/19/2011 [facility] Patient Information 2014 [place], [place].     Breastfeeding Deciding to breastfeed is one of the best choices you can make for you and your baby. A change in hormones during pregnancy causes your breast tissue to grow and increases the number and size of your milk ducts. These hormones also allow proteins, sugars, and fats from your blood supply to make breast milk in your milk-producing glands. Hormones prevent breast milk from being released before your baby is born as well as prompt milk flow after birth. Once breastfeeding has begun, thoughts of your baby, as well as his or her sucking or crying, can stimulate the release of milk from your milk-producing glands.  BENEFITS OF BREASTFEEDING For Your Baby  Your first milk (colostrum) helps your baby's digestive system function better.   There are antibodies in your milk that help your baby  fight off infections.   Your baby has a lower incidence of asthma, allergies, and sudden infant death syndrome.   The nutrients in breast milk are better for your baby than infant formulas and are designed uniquely for your baby's needs.   Breast milk improves your baby's brain development.   Your baby is less likely to develop other conditions, such as childhood obesity, asthma, or type 2 diabetes mellitus.  For You   Breastfeeding helps to create  a very special bond between you and your baby.   Breastfeeding is convenient. Breast milk is always available at the correct temperature and costs nothing.   Breastfeeding helps to burn calories and helps you lose the weight gained during pregnancy.   Breastfeeding makes your uterus contract to its prepregnancy size faster and slows bleeding (lochia) after you give birth.   Breastfeeding helps to lower your risk of developing type 2 diabetes mellitus, osteoporosis, and breast or ovarian cancer later in life. SIGNS THAT YOUR BABY IS HUNGRY Early Signs of Hunger  Increased alertness or activity.  Stretching.  Movement of the head from side to side.  Movement of the head and opening of the mouth when the corner of the mouth or cheek is stroked (rooting).  Increased sucking sounds, smacking lips, cooing, sighing, or squeaking.  Hand-to-mouth movements.  Increased sucking of fingers or hands. Late Signs of Hunger  Fussing.  Intermittent crying. Extreme Signs of Hunger Signs of extreme hunger will require calming and consoling before your baby will be able to breastfeed successfully. Do not wait for the following signs of extreme hunger to occur before you initiate breastfeeding:   Restlessness.  A loud, strong cry.   Screaming.   BREASTFEEDING BASICS Breastfeeding Initiation  Find a comfortable place to sit or lie down, with your neck and back well supported.  Place a pillow or rolled up blanket under your baby  to bring him or her to the level of your breast (if you are seated). Nursing pillows are specially designed to help support your arms and your baby while you breastfeed.  Make sure that your baby's abdomen is facing your abdomen.   Gently massage your breast. With your fingertips, massage from your chest wall toward your nipple in a circular motion. This encourages milk flow. You may need to continue this action during the feeding if your milk flows slowly.  Support your breast with 4 fingers underneath and your thumb above your nipple. Make sure your fingers are well away from your nipple and your baby's mouth.   Stroke your baby's lips gently with your finger or nipple.   When your baby's mouth is open wide enough, quickly bring your baby to your breast, placing your entire nipple and as much of the colored area around your nipple (areola) as possible into your baby's mouth.   More areola should be visible above your baby's upper lip than below the lower lip.   Your baby's tongue should be between his or her lower gum and your breast.   Ensure that your baby's mouth is correctly positioned around your nipple (latched). Your baby's lips should create a seal on your breast and be turned out (everted).  It is common for your baby to suck about 2-3 minutes in order to start the flow of breast milk. Latching Teaching your baby how to latch on to your breast properly is very important. An improper latch can cause nipple pain and decreased milk supply for you and poor weight gain in your baby. Also, if your baby is not latched onto your nipple properly, he or she may swallow some air during feeding. This can make your baby fussy. Burping your baby when you switch breasts during the feeding can help to get rid of the air. However, teaching your baby to latch on properly is still the best way to prevent fussiness from swallowing air while breastfeeding. Signs that your baby has successfully  latched on to your nipple:  Silent tugging or silent sucking, without causing you pain.   Swallowing heard between every 3-4 sucks.    Muscle movement above and in front of his or her ears while sucking.  Signs that your baby has not successfully latched on to nipple:   Sucking sounds or smacking sounds from your baby while breastfeeding.  Nipple pain. If you think your baby has not latched on correctly, slip your finger into the corner of your baby's mouth to break the suction and place it between your baby's gums. Attempt breastfeeding initiation again. Signs of Successful Breastfeeding Signs from your baby:   A gradual decrease in the number of sucks or complete cessation of sucking.   Falling asleep.   Relaxation of his or her body.   Retention of a small amount of milk in his or her mouth.   Letting go of your breast by himself or herself. Signs from you:  Breasts that have increased in firmness, weight, and size 1-3 hours after feeding.   Breasts that are softer immediately after breastfeeding.  Increased milk volume, as well as a change in milk consistency and color by the fifth day of breastfeeding.   Nipples that are not sore, cracked, or bleeding. Signs That Your [employer]el Books is Getting Enough Milk  Wetting at least 3 diapers in a 24-hour period. The urine should be clear and pale yellow by age 684 days.  At least 3 stools in a 24-hour period by age 684 days. The stool should be soft and yellow.  At least 3 stools in a 24-hour period by age 11 days. The stool should be seedy and yellow.  No loss of weight greater than 10% of birth weight during the first 90 days of age.  Average weight gain of 4-7 ounces (113-198 g) per week after age 77 days.  Consistent daily weight gain by age 114 days, without weight loss after the age of 2 weeks. After a feeding, your baby may spit up a small amount. This is common. BREASTFEEDING FREQUENCY AND DURATION Frequent feeding will  help you make more milk and can prevent sore nipples and breast engorgement. Breastfeed when you feel the need to reduce the fullness of your breasts or when your baby shows signs of hunger. This is called "breastfeeding on demand." Avoid introducing a pacifier to your baby while you are working to establish breastfeeding (the first 4-6 weeks after your baby is born). After this time you may choose to use a pacifier. Research has shown that pacifier use during the first year of a baby's life decreases the risk of sudden infant death syndrome (SIDS). Allow your baby to feed on each breast as long as he or she wants. Breastfeed until your baby is finished feeding. When your baby unlatches or falls asleep while feeding from the first breast, offer the second breast. Because newborns are often sleepy in the first few weeks of life, you may need to awaken your baby to get him or her to feed. Breastfeeding times will vary from baby to baby. However, the following rules can serve as a guide to help you ensure that your baby is properly fed:  Newborns (babies 73 weeks of age or younger) may breastfeed every 1-3 hours.  Newborns should not go longer than 3 hours during the day or 5 hours during the night without breastfeeding.  You should breastfeed your baby a minimum of 8 times in a 24-hour period until you begin to introduce solid foods to your  baby at around 8 months of age. BREAST MILK PUMPING Pumping and storing breast milk allows you to ensure that your baby is exclusively fed your breast milk, even at times when you are unable to breastfeed. This is especially important if you are going back to work while you are still breastfeeding or when you are not able to be present during feedings. Your lactation consultant can give you guidelines on how long it is safe to store breast milk.  A breast pump is a machine that allows you to pump milk from your breast into a sterile bottle. The pumped breast milk can then  be stored in a refrigerator or freezer. Some breast pumps are operated by hand, while others use electricity. Ask your lactation consultant which type will work best for you. Breast pumps can be purchased, but some hospitals and breastfeeding support groups lease breast pumps on a monthly basis. A lactation consultant can teach you how to hand express breast milk, if you prefer not to use a pump.  CARING FOR YOUR BREASTS WHILE YOU BREASTFEED Nipples can become dry, cracked, and sore while breastfeeding. The following recommendations can help keep your breasts moisturized and healthy:  Avoid using soap on your nipples.   Wear a supportive bra. Although not required, special nursing bras and tank tops are designed to allow access to your breasts for breastfeeding without taking off your entire bra or top. Avoid wearing underwire-style bras or extremely tight bras.  Air dry your nipples for 3-58minutes after each feeding.   Use only cotton bra pads to absorb leaked breast milk. Leaking of breast milk between feedings is normal.   Use lanolin on your nipples after breastfeeding. Lanolin helps to maintain your skin's normal moisture barrier. If you use pure lanolin, you do not need to wash it off before feeding your baby again. Pure lanolin is not toxic to your baby. You may also hand express a few drops of breast milk and gently massage that milk into your nipples and allow the milk to air dry. In the first few weeks after giving birth, some women experience extremely full breasts (engorgement). Engorgement can make your breasts feel heavy, warm, and tender to the touch. Engorgement peaks within 3-5 days after you give birth. The following recommendations can help ease engorgement:  Completely empty your breasts while breastfeeding or pumping. You may want to start by applying warm, moist heat (in the shower or with warm water-soaked hand towels) just before feeding or pumping. This increases  circulation and helps the milk flow. If your baby does not completely empty your breasts while breastfeeding, pump any extra milk after he or she is finished.  Wear a snug bra (nursing or regular) or tank top for 1-2 days to signal your body to slightly decrease milk production.  Apply ice packs to your breasts, unless this is too uncomfortable for you.  Make sure that your baby is latched on and positioned properly while breastfeeding. If engorgement persists after 48 hours of following these recommendations, contact your health care provider or a Science writer. OVERALL HEALTH CARE RECOMMENDATIONS WHILE BREASTFEEDING  Eat healthy foods. Alternate between meals and snacks, eating 3 of each per day. Because what you eat affects your breast milk, some of the foods may make your baby more irritable than usual. Avoid eating these foods if you are sure that they are negatively affecting your baby.  Drink milk, fruit juice, and water to satisfy your thirst (about 10 glasses a  day).   Rest often, relax, and continue to take your prenatal vitamins to prevent fatigue, stress, and anemia.  Continue breast self-awareness checks.  Avoid chewing and smoking tobacco.  Avoid alcohol and drug use. Some medicines that may be harmful to your baby can pass through breast milk. It is important to ask your health care provider before taking any medicine, including all over-the-counter and prescription medicine as well as vitamin and herbal supplements. It is possible to become pregnant while breastfeeding. If birth control is desired, ask your health care provider about options that will be safe for your baby. SEEK MEDICAL CARE IF:   You feel like you want to stop breastfeeding or have become frustrated with breastfeeding.  You have painful breasts or nipples.  Your nipples are cracked or bleeding.  Your breasts are red, tender, or warm.  You have a swollen area on either breast.  You have a  fever or chills.  You have nausea or vomiting.  You have drainage other than breast milk from your nipples.  Your breasts do not become full before feedings by the fifth day after you give birth.  You feel sad and depressed.  Your baby is too sleepy to eat well.  Your baby is having trouble sleeping.   Your baby is wetting less than 3 diapers in a 24-hour period.  Your baby has less than 3 stools in a 24-hour period.  Your baby's skin or the white part of his or her eyes becomes yellow.   Your baby is not gaining weight by 43 days of age. SEEK IMMEDIATE MEDICAL CARE IF:   Your baby is overly tired (lethargic) and does not want to wake up and feed.  Your baby develops an unexplained fever. Document Released: 05/13/2005 Document Revised: 05/18/2013 Document Reviewed: 11/04/2012 [facility] Patient Information 2015 [place], [place]. This information is not intended to replace advice given to you by your health care provider. Make sure you discuss any questions you have with your health care provider.

## 2014-05-26 ENCOUNTER — Ambulatory Visit: Payer: Self-pay

## 2014-05-26 NOTE — Lactation Note (Signed)
This note was copied from the chart of Julia Lyons. Lactation Consultation Note    Follow up consult with this mom  of a term baby  , now 20 hours old, and cluster feeding. Mom is very full, maybe engorged on left breast, full but soft on right breast. I observed baby latching, and the baby latched quickly  on her own. I cautioned mom   To unlatch baby if latch is uncomfortable, and to make sure her bottom lip is flanged. Breast care reviewed/engorgement care. Mom knows to call for questions/concerns.   Patient Name: Julia Lyons Date: 05/26/2014 Reason for consult: Follow-up assessment   Maternal Data    Feeding Feeding Type: Breast Fed Length of feed: 15 min  LATCH Score/Interventions Latch: Grasps breast easily, tongue down, lips flanged, rhythmical sucking. Intervention(s): Breast massage  Audible Swallowing: Spontaneous and intermittent  Type of Nipple: Everted at rest and after stimulation  Comfort (Breast/Nipple): Soft / non-tender  Problem noted: Mild/Moderate discomfort (nipple pinched after latch) Interventions (Filling): Massage;Firm support;Hand pump  Hold (Positioning): Assistance needed to correctly position infant at breast and maintain latch. Intervention(s): Breastfeeding basics reviewed;Support Pillows;Position options;Skin to skin  LATCH Score: 9  Lactation Tools Discussed/Used     Consult Status Consult Status: Complete Follow-up type: Call as needed    Tonna Corner 05/26/2014, 10:50 AM

## 2014-06-08 ENCOUNTER — Encounter (HOSPITAL_COMMUNITY): Payer: Self-pay | Admitting: Obstetrics and Gynecology

## 2014-06-29 ENCOUNTER — Ambulatory Visit (INDEPENDENT_AMBULATORY_CARE_PROVIDER_SITE_OTHER): Payer: PRIVATE HEALTH INSURANCE | Admitting: Family Medicine

## 2014-06-29 ENCOUNTER — Ambulatory Visit (INDEPENDENT_AMBULATORY_CARE_PROVIDER_SITE_OTHER): Payer: PRIVATE HEALTH INSURANCE

## 2014-06-29 VITALS — BP 90/68 | HR 84 | Temp 98.9°F | Resp 16 | Ht 61.5 in | Wt 115.4 lb

## 2014-06-29 DIAGNOSIS — M940 Chondrocostal junction syndrome [Tietze]: Secondary | ICD-10-CM

## 2014-06-29 DIAGNOSIS — R0600 Dyspnea, unspecified: Secondary | ICD-10-CM

## 2014-06-29 DIAGNOSIS — M9908 Segmental and somatic dysfunction of rib cage: Secondary | ICD-10-CM

## 2014-06-29 MED ORDER — IBUPROFEN 600 MG PO TABS: 600.0000 mg | ORAL_TABLET | Freq: Two times a day (BID) | ORAL | Status: DC
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
## 2014-06-29 NOTE — Patient Instructions (Signed)
Costochondritis Costochondritis, sometimes called Tietze syndrome, is a swelling and irritation (inflammation) of the tissue (cartilage) that connects your ribs with your breastbone (sternum). It causes pain in the chest and rib area. Costochondritis usually goes away on its own over time. It can take up to 6 weeks or longer to get better, especially if you are unable to limit your activities. CAUSES  Some cases of costochondritis have no known cause. Possible causes include:  Injury (trauma).  Exercise or activity such as lifting.  Severe coughing. SIGNS AND SYMPTOMS  Pain and tenderness in the chest and rib area.  Pain that gets worse when coughing or taking deep breaths.  Pain that gets worse with specific movements. DIAGNOSIS  Your health care provider will do a physical exam and ask about your symptoms. Chest X-rays or other tests may be done to rule out other problems. TREATMENT  Costochondritis usually goes away on its own over time. Your health care provider may prescribe medicine to help relieve pain. HOME CARE INSTRUCTIONS   Avoid exhausting physical activity. Try not to strain your ribs during normal activity. This would include any activities using chest, abdominal, and side muscles, especially if heavy weights are used.  Apply ice to the affected area for the first 2 days after the pain begins.  Put ice in a plastic bag.  Place a towel between your skin and the bag.  Leave the ice on for 20 minutes, 2-3 times a day.  Only take over-the-counter or prescription medicines as directed by your health care provider. SEEK MEDICAL CARE IF:  You have redness or swelling at the rib joints. These are signs of infection.  Your pain does not go away despite rest or medicine. SEEK IMMEDIATE MEDICAL CARE IF:   Your pain increases or you are very uncomfortable.  You have shortness of breath or difficulty breathing.  You cough up blood.  You have worse chest pains,  sweating, or vomiting.  You have a fever or persistent symptoms for more than 2-3 days.  You have a fever and your symptoms suddenly get worse. MAKE SURE YOU:   Understand these instructions.  Will watch your condition.  Will get help right away if you are not doing well or get worse. Document Released: 02/20/2005 Document Revised: 03/03/2013 Document Reviewed: 12/15/2012 [employer] Patient Information 2015 [employer]. This information is not intended to replace advice given to you by your health care provider. Make sure you discuss any questions you have with your health care provider.  

## 2014-06-30 NOTE — Progress Notes (Signed)
Xray read and patient discussed with Dr. Paulla Fore. Agree with assessment and plan of care per his note.

## 2014-09-02 DIAGNOSIS — Z0271 Encounter for disability determination: Secondary | ICD-10-CM

## 2014-09-05 ENCOUNTER — Ambulatory Visit (INDEPENDENT_AMBULATORY_CARE_PROVIDER_SITE_OTHER): Payer: PRIVATE HEALTH INSURANCE | Admitting: Physician Assistant

## 2014-09-05 VITALS — BP 110/68 | HR 67 | Temp 97.9°F | Resp 16 | Ht 62.0 in | Wt 119.0 lb

## 2014-09-05 DIAGNOSIS — J040 Acute laryngitis: Secondary | ICD-10-CM

## 2014-09-05 MED ORDER — IPRATROPIUM BROMIDE 0.03 % NA SOLN: 2.0000 | Freq: Two times a day (BID) | NASAL | Status: DC

## 2014-09-05 MED ORDER — BENZONATATE 100 MG PO CAPS: 100.0000 mg | ORAL_CAPSULE | Freq: Three times a day (TID) | ORAL | Status: DC | PRN

## 2014-09-05 NOTE — Progress Notes (Signed)
Subjective:    Patient ID: Julia Lyons, female    DOB: 1991/05/13, 24 y.o.   MRN: [id number]  HPI Patient presents for 6 days of sore throat that got worse 3 days ago and has now loss voice. Additionally endorses nasal congestion, mild cough with some mucus productions, ear pressure, and HA. Denies fever, difficulty swallowing, rhinorrhea, sinus pressure, or N/V. Has tried chloraseptic throat spray, tylenol cold then mucinex with some relief. H/o childhood asthma, but no recurrence as an adult and no h/o asthma. Is not a smoker. Has 3 month child at home and worried she will get baby sick. Allergic to depo-provera.   Review of Systems  Constitutional: Negative for fever, chills, appetite change and fatigue.  HENT: Positive for congestion, ear pain and sore throat. Negative for ear discharge, postnasal drip, rhinorrhea, sinus pressure, sneezing and trouble swallowing.   Eyes: Negative.   Respiratory: Positive for cough. Negative for shortness of breath and wheezing.   Cardiovascular: Negative for chest pain.  Gastrointestinal: Negative for nausea and vomiting.  Musculoskeletal: Negative for neck pain and neck stiffness.  Allergic/Immunologic: Negative for environmental allergies.  Neurological: Positive for headaches. Negative for dizziness.       Objective:   Physical Exam  Constitutional: She is oriented to person, place, and time. She appears well-developed and well-nourished. No distress.  Blood pressure 110/68, pulse 67, temperature 97.9 F (36.6 C), temperature source Oral, resp. rate 16, height 5\' 2"  (1.575 m), weight 119 lb (53.978 kg), last menstrual period 09/02/2014, SpO2 98 %, currently breastfeeding.  HENT:  Head: Normocephalic and atraumatic.  Right Ear: External ear normal. No drainage, swelling or tenderness. A foreign body (copious cerumen) is present.  Left Ear: External ear normal. No drainage, swelling or tenderness. A foreign body (copious cerumen) is  present.  Nose: Rhinorrhea (with moderate erythema) present. No mucosal edema. Right sinus exhibits no maxillary sinus tenderness and no frontal sinus tenderness. Left sinus exhibits no maxillary sinus tenderness and no frontal sinus tenderness.  Mouth/Throat: Uvula is midline and mucous membranes are normal. Posterior oropharyngeal erythema present. No oropharyngeal exudate, posterior oropharyngeal edema or tonsillar abscesses.  Tonsil 2+ hypertrophic bilaterally with mild erythema and no exudate.  Eyes: Conjunctivae are normal. Pupils are equal, round, and reactive to light. Right eye exhibits no discharge. Left eye exhibits no discharge. No scleral icterus.  Neck: Normal range of motion. Neck supple. No thyromegaly present.  Cardiovascular: Normal rate, regular rhythm and normal heart sounds.  Exam reveals no gallop and no friction rub.   No murmur heard. Pulmonary/Chest: Effort normal and breath sounds normal. No respiratory distress. She has no decreased breath sounds. She has no wheezes. She has no rhonchi. She has no rales.  Abdominal: Soft. Bowel sounds are normal. She exhibits no distension. There is no tenderness. There is no rebound and no guarding.  Lymphadenopathy:    She has no cervical adenopathy.  Neurological: She is alert and oriented to person, place, and time.  Skin: Skin is warm and dry. No rash noted. She is not diaphoretic. No erythema. No pallor.      Assessment & Plan:  1. Laryngitis May continue Chloraseptic throat spray and Mucinex (guafenisin).Should drink plenty of water.  - ipratropium (ATROVENT) 0.03 % nasal spray; Place 2 sprays into both nostrils 2 (two) times daily.  Dispense: 30 mL; Refill: 0 - benzonatate (TESSALON) 100 MG capsule; Take 1-2 capsules (100-200 mg total) by mouth 3 (three) times daily as needed for cough.  Dispense:  40 capsule; Refill: 0   Lerae Langham PA-C  [facility] 09/05/2014 4:18 PM

## 2014-09-05 NOTE — Patient Instructions (Signed)
May continue Chloraseptic throat spray and Mucinex (guafenisin).  Laryngitis At the top of your windpipe is your voice box. It is the source of your voice. Inside your voice box are 2 bands of muscles called vocal cords. When you breathe, your vocal cords are relaxed and open so that air can get into the lungs. When you decide to say something, these cords come together and vibrate. The sound from these vibrations goes into your throat and comes out through your mouth as sound. Laryngitis is an inflammation of the vocal cords that causes hoarseness, cough, loss of voice, sore throat, and dry throat. Laryngitis can be temporary (acute) or long-term (chronic). Most cases of acute laryngitis improve with time.Chronic laryngitis lasts for more than 3 weeks. CAUSES Laryngitis can often be related to excessive smoking, talking, or yelling, as well as inhalation of toxic fumes and allergies. Acute laryngitis is usually caused by a viral infection, vocal strain, measles or mumps, or bacterial infections. Chronic laryngitis is usually caused by vocal cord strain, vocal cord injury, postnasal drip, growths on the vocal cords, or acid reflux. SYMPTOMS   Cough.  Sore throat.  Dry throat. RISK FACTORS  Respiratory infections.  Exposure to irritating substances, such as cigarette smoke, excessive amounts of alcohol, stomach acids, and workplace chemicals.  Voice trauma, such as vocal cord injury from shouting or speaking too loud. DIAGNOSIS  Your cargiver will perform a physical exam. During the physical exam, your caregiver will examine your throat. The most common sign of laryngitis is hoarseness. Laryngoscopy may be necessary to confirm the diagnosis of this condition. This procedure allows your caregiver to look into the larynx. HOME CARE INSTRUCTIONS  Drink enough fluids to keep your urine clear or pale yellow.  Rest until you no longer have symptoms or as directed by your caregiver.  Breathe in  moist air.  Take all medicine as directed by your caregiver.  Do not smoke.  Talk as little as possible (this includes whispering).  Write on paper instead of talking until your voice is back to normal.  Follow up with your caregiver if your condition has not improved after 10 days. SEEK MEDICAL CARE IF:   You have trouble breathing.  You cough up blood.  You have persistent fever.  You have increasing pain.  You have difficulty swallowing. MAKE SURE YOU:  Understand these instructions.  Will watch your condition.  Will get help right away if you are not doing well or get worse. Document Released: 05/13/2005 Document Revised: 08/05/2011 Document Reviewed: 07/19/2010 [facility] Patient Information 2015 [place], [place]. This information is not intended to replace advice given to you by your health care provider. Make sure you discuss any questions you have with your health care provider.

## 2014-11-08 ENCOUNTER — Encounter (HOSPITAL_COMMUNITY): Payer: Self-pay | Admitting: Obstetrics and Gynecology

## 2015-07-07 LAB — HM PAP SMEAR

## 2015-07-18 ENCOUNTER — Ambulatory Visit (INDEPENDENT_AMBULATORY_CARE_PROVIDER_SITE_OTHER): Payer: PRIVATE HEALTH INSURANCE | Admitting: Physician Assistant

## 2015-07-18 VITALS — BP 101/69 | HR 65 | Temp 97.6°F | Resp 16

## 2015-07-18 DIAGNOSIS — S61411A Laceration without foreign body of right hand, initial encounter: Secondary | ICD-10-CM | POA: Diagnosis not present

## 2015-07-18 NOTE — Patient Instructions (Signed)
Please do not submerge your hand in water.  You will keep this dry and clean for 24 hours, then you may remove the bandage. I would like you make sure your hands do not touch contaminated plates, produce, etc. You will need to wear a waterproof glove while working tonight.      Sutured Wound Care Sutures are stitches that can be used to close wounds. Taking care of your wound properly can help to prevent pain and infection. It can also help your wound to heal more quickly. HOW TO CARE FOR YOUR SUTURED WOUND Wound Care  Keep the wound clean and dry.  If you were given a bandage (dressing), you should change it at least once per day or as directed by your health care provider. You should also change it if it becomes wet or dirty.  Keep the wound completely dry for the first 24 hours or as directed by your health care provider. After that time, you may shower or bathe. However, make sure that the wound is not soaked in water until the sutures have been removed.  Clean the wound one time each day or as directed by your health care provider.  Wash the wound with soap and water.  Rinse the wound with water to remove all soap.  Pat the wound dry with a clean towel. Do not rub the wound.  Aftercleaning the wound, apply a thin layer of antibioticointment as directed by your health care provider. This will help to prevent infection and keep the dressing from sticking to the wound.  Have the sutures removed as directed by your health care provider. General Instructions  Take or apply medicines only as directed by your health care provider.  To help prevent scarring, make sure to cover your wound with sunscreen whenever you are outside after the sutures are removed and the wound is healed. Make sure to wear a sunscreen of at least 30 SPF.  If you were prescribed an antibiotic medicine or ointment, finish all of it even if you start to feel better.  Do not scratch or pick at the  wound.  Keep all follow-up visits as directed by your health care provider. This is important.  Check your wound every day for signs of infection. Watch for:   Redness, swelling, or pain.  Fluid, blood, or pus.  Raise (elevate) the injured area above the level of your heart while you are sitting or lying down, if possible.  Avoid stretching your wound.  Drink enough fluids to keep your urine clear or pale yellow. SEEK MEDICAL CARE IF:  You received a tetanus shot and you have swelling, severe pain, redness, or bleeding at the injection site.  You have a fever.  A wound that was closed breaks open.  You notice a bad smell coming from the wound.  You notice something coming out of the wound, such as wood or glass.  Your pain is not controlled with medicine.  You have increased redness, swelling, or pain at the site of your wound.  You have fluid, blood, or pus coming from your wound.  You notice a change in the color of your skin near your wound.  You need to change the dressing frequently due to fluid, blood, or pus draining from the wound.  You develop a new rash.  You develop numbness around the wound. SEEK IMMEDIATE MEDICAL CARE IF:  You develop severe swelling around the injury site.  Your pain suddenly increases and is severe.  You develop painful lumps near the wound or on skin that is anywhere on your body.  You have a red streak going away from your wound.  The wound is on your hand or foot and you cannot properly move a finger or toe.  The wound is on your hand or foot and you notice that your fingers or toes look pale or bluish.   This information is not intended to replace advice given to you by your health care provider. Make sure you discuss any questions you have with your health care provider.   Document Released: 06/20/2004 Document Revised: 09/27/2014 Document Reviewed: 12/23/2012 [employer] Interactive Patient Education [employer].

## 2015-07-18 NOTE — Progress Notes (Signed)
[facility] [address] [place] [id number] [phone]  Date:  07/18/2015   Name:  Julia Lyons   DOB:  [birth year]   MRN:  [id number]  PCP:  Eliezer Lofts, MD    History of Present Illness:  Julia Lyons is a 25 y.o. female patient who presents to [facility]     Patient Active Problem List   Diagnosis Date Noted  . Positive GBS test 05/23/2014  . NSVD (normal spontaneous vaginal delivery) - Successful waterbirth 05/23/2014  . Obstetric labial laceration, delivered, current hospitalization 05/23/2014  . Chlamydia trachomatis infection 10/13/2012  . Exercise-induced asthma 10/22/2011    Past Medical History  Diagnosis Date  . Asthma   . GERD (gastroesophageal reflux disease)   . History of chlamydia 06/2011    treated with doxy  . Cough 06/07/2008    Qualifier: Diagnosis of  By: Diona Browner MD, Amy      Past Surgical History  Procedure Laterality Date  . Wisdom tooth extraction      Social History  Substance Use Topics  . Smoking status: Never Smoker   . Smokeless tobacco: Never Used  . Alcohol Use: 0.6 oz/week    1 Glasses of wine per week     Comment: every few weeks; but not while pregnant    No family history on file.  Allergies  Allergen Reactions  . Depo-Provera [Medroxyprogesterone] Other (See Comments)    Severe migraine and muscle spasms    Medication list has been reviewed and updated.  Current Outpatient Prescriptions on File Prior to Visit  Medication Sig Dispense Refill  . benzonatate (TESSALON) 100 MG capsule Take 1-2 capsules (100-200 mg total) by mouth 3 (three) times daily as needed for cough. (Patient not taking: Reported on 07/18/2015) 40 capsule 0  . butalbital-acetaminophen-caffeine (FIORICET WITH CODEINE) 50-325-40-30 MG per capsule Take 1 capsule by mouth every 6 (six) hours as needed for headache or migraine. Reported on 07/18/2015    . ferrous sulfate 325 (65 FE) MG EC tablet Take 1 tablet (325 mg total) by  mouth 2 (two) times daily. (Patient not taking: Reported on 09/05/2014) 60 tablet 3  . ibuprofen (ADVIL,MOTRIN) 600 MG tablet Take 1 tablet (600 mg total) by mouth 2 (two) times daily. X 5-7 days then as needed (Patient not taking: Reported on 09/05/2014) 60 tablet 0  . ipratropium (ATROVENT) 0.03 % nasal spray Place 2 sprays into both nostrils 2 (two) times daily. (Patient not taking: Reported on 07/18/2015) 30 mL 0  . Prenatal Vit-Fe Fumarate-FA (PRENATAL MULTIVITAMIN) TABS tablet Take 1 tablet by mouth daily at 12 noon. Reported on 07/18/2015    . senna-docusate (SENOKOT-S) 8.6-50 MG per tablet Take 2 tablets by mouth daily. (Patient not taking: Reported on 09/05/2014) 60 tablet 1   No current facility-administered medications on file prior to visit.    ROS ROS otherwise unremarkable unless listed above.   Physical Examination: BP 101/69 mmHg  Pulse 65  Temp(Src) 97.6 F (36.4 C) (Oral)  Resp 16  SpO2 99%  LMP 07/14/2015 Ideal Body Weight:    Physical Exam  Constitutional: She is oriented to person, place, and time. She appears well-developed and well-nourished. No distress.  HENT:  Head: Normocephalic and atraumatic.  Right Ear: External ear normal.  Left Ear: External ear normal.  Eyes: Conjunctivae and EOM are normal. Pupils are equal, round, and reactive to light.  Cardiovascular: Normal rate.   Pulmonary/Chest: Effort normal. No respiratory distress.  Neurological: She is  alert and oriented to person, place, and time. No cranial nerve deficit. Coordination normal.  Skin: She is not diaphoretic.  Right hand with 2.4cm laceration at the dorsum 1st finger just proximal to the mcp.  Dermal layer shaved injury to subcutaneous laceration.    Psychiatric: She has a normal mood and affect. Her behavior is normal.   Procedure: Verbal consent obtained.  1% lidocaine placed in the wound site.  Cleansed rigorously with soap and water.  Wound searched for foreign body which is absent.  5-0  ethilon used to place simple interrupted sutures at the 2.4 cm woundsite at dorsal 1st finger right hand.  Cleansed with normal saline.  Wound dressing applied.    Assessment and Plan: Julia Lyons is a 25 y.o. female who is here today for cc of laceration at her right hand.   Patient reports that she had tetanus within the last 5 years.  Wound/suture care discussed.    Advised to return in 7 days for suture removal.  Laceration of right hand, initial encounter    Ivar Drape, PA-C [facility] 07/18/2015 1:18 PM

## 2015-07-25 ENCOUNTER — Ambulatory Visit (INDEPENDENT_AMBULATORY_CARE_PROVIDER_SITE_OTHER): Payer: PRIVATE HEALTH INSURANCE | Admitting: Physician Assistant

## 2015-07-25 VITALS — BP 120/80 | HR 70 | Temp 98.1°F | Resp 16 | Ht 62.0 in | Wt 119.0 lb

## 2015-07-25 DIAGNOSIS — Z4802 Encounter for removal of sutures: Secondary | ICD-10-CM

## 2015-07-25 DIAGNOSIS — S61411A Laceration without foreign body of right hand, initial encounter: Secondary | ICD-10-CM

## 2015-07-25 NOTE — Progress Notes (Signed)
   Julia Lyons  MRN: [id number] DOB: January 31, 1991  Subjective:  Pt presents to clinic for suture removal.  The area is still sore but it is feeling better.  Patient Active Problem List   Diagnosis Date Noted  . Positive GBS test 05/23/2014  . NSVD (normal spontaneous vaginal delivery) - Successful waterbirth 05/23/2014  . Obstetric labial laceration, delivered, current hospitalization 05/23/2014  . Chlamydia trachomatis infection 10/13/2012  . Exercise-induced asthma 10/22/2011    Current Outpatient Prescriptions on File Prior to Visit  Medication Sig Dispense Refill  . butalbital-acetaminophen-caffeine (FIORICET WITH CODEINE) 50-325-40-30 MG per capsule Take 1 capsule by mouth every 6 (six) hours as needed for headache or migraine. Reported on 07/18/2015    . ibuprofen (ADVIL,MOTRIN) 600 MG tablet Take 1 tablet (600 mg total) by mouth 2 (two) times daily. X 5-7 days then as needed 60 tablet 0  . ipratropium (ATROVENT) 0.03 % nasal spray Place 2 sprays into both nostrils 2 (two) times daily. 30 mL 0  . Prenatal Vit-Fe Fumarate-FA (PRENATAL MULTIVITAMIN) TABS tablet Take 1 tablet by mouth daily at 12 noon. Reported on 07/18/2015    . senna-docusate (SENOKOT-S) 8.6-50 MG per tablet Take 2 tablets by mouth daily. 60 tablet 1  . benzonatate (TESSALON) 100 MG capsule Take 1-2 capsules (100-200 mg total) by mouth 3 (three) times daily as needed for cough. (Patient not taking: Reported on 07/25/2015) 40 capsule 0  . ferrous sulfate 325 (65 FE) MG EC tablet Take 1 tablet (325 mg total) by mouth 2 (two) times daily. (Patient not taking: Reported on 09/05/2014) 60 tablet 3   No current facility-administered medications on file prior to visit.    Allergies  Allergen Reactions  . Depo-Provera [Medroxyprogesterone] Other (See Comments)    Severe migraine and muscle spasms    Review of Systems  Constitutional: Negative for fever.  Skin: Positive for wound.   Objective:  BP 120/80 mmHg   Pulse 70  Temp(Src) 98.1 F (36.7 C) (Oral)  Resp 16  Ht 5\' 2"  (1.575 m)  Wt 119 lb (53.978 kg)  BMI 21.76 kg/m2  SpO2 96%  LMP 07/14/2015  Physical Exam  Constitutional: She is oriented to person, place, and time and well-developed, well-nourished, and in no distress.  HENT:  Head: Normocephalic and atraumatic.  Right Ear: Hearing and external ear normal.  Left Ear: Hearing and external ear normal.  Eyes: Conjunctivae are normal.  Neck: Normal range of motion.  Pulmonary/Chest: Effort normal.  Neurological: She is alert and oriented to person, place, and time. Gait normal.  Skin: Skin is warm and dry.  Well healed wound with some surrounding ecchymosis and TTP - sutures removed with out problems  Psychiatric: Mood, memory, affect and judgment normal.  Vitals reviewed.   Assessment and Plan :  Laceration of right hand, initial encounter  Visit for suture removal   Wound care d/w pt.  Windell Hummingbird PA-C  [facility] 07/25/2015 1:38 PM

## 2015-09-25 DIAGNOSIS — I809 Phlebitis and thrombophlebitis of unspecified site: Secondary | ICD-10-CM

## 2015-09-25 HISTORY — DX: Phlebitis and thrombophlebitis of unspecified site: I80.9

## 2015-10-16 ENCOUNTER — Ambulatory Visit (INDEPENDENT_AMBULATORY_CARE_PROVIDER_SITE_OTHER): Payer: PRIVATE HEALTH INSURANCE | Admitting: Physician Assistant

## 2015-10-16 ENCOUNTER — Ambulatory Visit (HOSPITAL_BASED_OUTPATIENT_CLINIC_OR_DEPARTMENT_OTHER)
Admission: RE | Admit: 2015-10-16 | Discharge: 2015-10-16 | Disposition: A | Discharge status: Home / Self Care | Payer: PRIVATE HEALTH INSURANCE | Source: Ambulatory Visit | Attending: Physician Assistant | Admitting: Physician Assistant

## 2015-10-16 VITALS — BP 100/80 | HR 73 | Temp 98.2°F | Resp 16 | Ht 61.5 in | Wt 126.0 lb

## 2015-10-16 DIAGNOSIS — I82811 Embolism and thrombosis of superficial veins of right lower extremities: Secondary | ICD-10-CM | POA: Diagnosis not present

## 2015-10-16 MED ORDER — RIVAROXABAN (XARELTO) VTE STARTER PACK (15 & 20 MG): ORAL_TABLET | ORAL | Status: DC

## 2015-10-16 MED ORDER — RIVAROXABAN 20 MG PO TABS: 20.0000 mg | ORAL_TABLET | Freq: Every day | ORAL | Status: DC

## 2015-10-16 NOTE — Patient Instructions (Addendum)
You are to go over to [facility] now for your doppler.  [address] [place], [place] [address].  [phone]    If the scan is positive for a deep venous bloot clot then start Xarelto.  If negative then start taking a baby aspirin daily.     IF you received an x-ray today, you will receive an invoice from [facility]. Please contact [facility] at [phone] with questions or concerns regarding your invoice.   IF you received labwork today, you will receive an invoice from [facility]. Please contact [facility] at [phone] with questions or concerns regarding your invoice.   Our billing staff will not be able to assist you with questions regarding bills from these companies.  You will be contacted with the lab results as soon as they are available. The fastest way to get your results is to activate your My Chart account. Instructions are located on the last page of this paperwork. If you have not heard from [place] regarding the results in 2 weeks, please contact this office.

## 2015-10-16 NOTE — Progress Notes (Signed)
10/17/2015 10:57 AM   DOB: 08-30-1990 / MRN: [id number]  SUBJECTIVE:  Julia Lyons is a 25 y.o. female presenting for two masses on the back of the left leg.  Reports these are non tender and appeared five days ago.  Denies fever, chills, SOB.  Denies posterior calf pain, painful ambulation.  She takes birth control. She does not smoke. No long plane rides, recent surgery.  She does not take anticoagulants.   She is allergic to depo-provera.   She  has a past medical history of Asthma; GERD (gastroesophageal reflux disease); History of chlamydia (06/2011); and Cough (06/07/2008).    She  reports that she has never smoked. She has never used smokeless tobacco. She reports that she drinks about 0.6 oz of alcohol per week. She reports that she does not use illicit drugs. She  reports that she currently engages in sexual activity. She reports using the following method of birth control/protection: None. The patient  has past surgical history that includes Wisdom tooth extraction.  Her family history is not on file.  Review of Systems  Constitutional: Negative for fever.  Respiratory: Negative for cough.   Cardiovascular: Negative for chest pain.  Neurological: Negative for dizziness.    Problem list and medications reviewed and updated by myself where necessary, and exist elsewhere in the encounter.   OBJECTIVE:  BP 100/80 mmHg  Pulse 73  Temp(Src) 98.2 F (36.8 C) (Oral)  Resp 16  Ht 5' 1.5" (1.562 m)  Wt 126 lb (57.153 kg)  BMI 23.42 kg/m2  LMP 10/14/2015  Physical Exam  Constitutional: She is oriented to person, place, and time. She appears well-nourished. No distress.  Eyes: EOM are normal. Pupils are equal, round, and reactive to light.  Cardiovascular: Normal rate and regular rhythm.   Pulmonary/Chest: Effort normal.  Abdominal: She exhibits no distension.  Neurological: She is alert and oriented to person, place, and time. No cranial nerve deficit. Gait normal.    Skin: Skin is dry. She is not diaphoretic.     Psychiatric: She has a normal mood and affect.  Vitals reviewed.   No results found for this or any previous visit (from the past 72 hour(s)).  US Venous Img Lower Unilateral Right  10/16/2015  CLINICAL DATA:  Two right medial knots 5 days. Right lower extremity pain. EXAM: RIGHT LOWER EXTREMITY VENOUS DOPPLER ULTRASOUND TECHNIQUE: Gray-scale sonography with graded compression, as well as color Doppler and duplex ultrasound were performed to evaluate the lower extremity deep venous systems from the level of the common femoral vein and including the common femoral, femoral, profunda femoral, popliteal and calf veins including the posterior tibial, peroneal and gastrocnemius veins when visible. The superficial great saphenous vein was also interrogated. Spectral Doppler was utilized to evaluate flow at rest and with distal augmentation maneuvers in the common femoral, femoral and popliteal veins. COMPARISON:  None. FINDINGS: Contralateral Common Femoral Vein: Respiratory phasicity is normal and symmetric with the symptomatic side. No evidence of thrombus. Normal compressibility. Common Femoral Vein: No evidence of thrombus. Normal compressibility, respiratory phasicity and response to augmentation. Saphenofemoral Junction: No evidence of thrombus. Normal compressibility and flow on color Doppler imaging. Profunda Femoral Vein: No evidence of thrombus. Normal compressibility and flow on color Doppler imaging. Femoral Vein: No evidence of thrombus. Normal compressibility, respiratory phasicity and response to augmentation. Popliteal Vein: No evidence of thrombus. Normal compressibility, respiratory phasicity and response to augmentation. Calf Veins: No evidence of thrombus. Normal compressibility and flow on color Doppler  imaging. Superficial Great Saphenous Vein: No evidence of thrombus. Normal compressibility and flow on color Doppler imaging. Venous Reflux:   None. Other Findings: There is thrombus within a superficial vein over the medial lower leg at the level of the knee with moderate bulbous appearance to this thrombosed superficial vein corresponding to patient's palpable abnormality involving perforaters to the greater saphenous vein. IMPRESSION: No evidence of deep venous thrombosis. Thrombosis of superficial vein of medial lower extremity at the level of the knee with bulbous appearance to this thrombosed pain corresponding to patient's palpable abnormality. Electronically Signed   By: Marin Olp M.D.   On: 10/16/2015 21:18    ASSESSMENT AND PLAN  Julia Lyons was seen today for bumps on back of right knee.  Diagnoses and all orders for this visit:  Acute superficial venous thrombosis of lower extremity, right: She does take birth control.  She is a never smoker.  Will get a scan today.  If any deep structures are involved will start Xarelto.  She is not breast feeding.  -     VAS [place] LOWER EXTREMITY VENOUS (DVT); Future   The patient was advised to call or return to clinic if she does not see an improvement in symptoms or to seek the care of the closest emergency department if she worsens with the above plan.   Philis Fendt, MHS, PA-C [facility] 10/17/2015 10:57 AM   Addendum: Scan negative for DVT, positive for superficial vein thrombosis. Advised patient via phone. She will start ASA 81 mg daily for 6 weeks.  Philis Fendt, MS, PA-C 12:49 PM, 10/17/2015

## 2015-10-17 ENCOUNTER — Telehealth: Payer: Self-pay

## 2015-10-17 NOTE — Telephone Encounter (Signed)
[facility] - Pt returned your call

## 2016-09-27 ENCOUNTER — Ambulatory Visit (INDEPENDENT_AMBULATORY_CARE_PROVIDER_SITE_OTHER): Payer: PRIVATE HEALTH INSURANCE | Admitting: Family Medicine

## 2016-09-27 ENCOUNTER — Encounter: Payer: Self-pay | Admitting: Family Medicine

## 2016-09-27 ENCOUNTER — Other Ambulatory Visit: Payer: Self-pay | Admitting: Family Medicine

## 2016-09-27 VITALS — BP 96/67 | HR 77 | Temp 98.2°F | Resp 20 | Ht 62.0 in | Wt 128.0 lb

## 2016-09-27 DIAGNOSIS — J301 Allergic rhinitis due to pollen: Secondary | ICD-10-CM | POA: Diagnosis not present

## 2016-09-27 DIAGNOSIS — H6123 Impacted cerumen, bilateral: Secondary | ICD-10-CM

## 2016-09-27 DIAGNOSIS — J4521 Mild intermittent asthma with (acute) exacerbation: Secondary | ICD-10-CM | POA: Diagnosis not present

## 2016-09-27 DIAGNOSIS — J339 Nasal polyp, unspecified: Secondary | ICD-10-CM | POA: Diagnosis not present

## 2016-09-27 DIAGNOSIS — J45990 Exercise induced bronchospasm: Secondary | ICD-10-CM | POA: Diagnosis not present

## 2016-09-27 MED ORDER — FLUTICASONE PROPIONATE 50 MCG/ACT NA SUSP: 2.0000 | Freq: Every day | NASAL | 6 refills | Status: DC

## 2016-09-27 MED ORDER — CETIRIZINE HCL 10 MG PO TABS: 10.0000 mg | ORAL_TABLET | Freq: Every day | ORAL | 11 refills | Status: DC

## 2016-09-27 MED ORDER — ALBUTEROL SULFATE (2.5 MG/3ML) 0.083% IN NEBU
2.5000 mg | INHALATION_SOLUTION | Freq: Once | RESPIRATORY_TRACT | Status: AC
  Administered 2016-09-27: 2.5 mg via RESPIRATORY_TRACT

## 2016-09-27 MED ORDER — CARBAMIDE PEROXIDE 6.5 % OT SOLN: OTIC | 2 refills | Status: DC

## 2016-09-27 MED ORDER — PREDNISONE 20 MG PO TABS: ORAL_TABLET | ORAL | 0 refills | Status: DC

## 2016-09-27 MED ORDER — ALBUTEROL SULFATE HFA 108 (90 BASE) MCG/ACT IN AERS: 2.0000 | INHALATION_SPRAY | Freq: Four times a day (QID) | RESPIRATORY_TRACT | 2 refills | Status: DC | PRN

## 2016-09-27 NOTE — Progress Notes (Signed)
Patient ID: Julia Lyons, female  DOB: 24-Aug-1990, 24 y.o.   MRN: [id number] Patient Care Team    Relationship Specialty Notifications Start End  Ma Hillock, DO PCP - General Family Medicine  09/27/16   Earnstine Regal, PA-C Physician Assistant Obstetrics and Gynecology  09/27/16     Chief Complaint  Patient presents with  . Establish Care  . Chest Pain    with inspiration    Subjective:  Julia Lyons is a 26 y.o.  female present for new patient establishment. All past medical history, surgical history, allergies, family history, immunizations, medications and social history were obtained and entered in the electronic medical record today. All recent labs, ED visits and hospitalizations within the last year were reviewed.  Patient complains of chest tightness today. She reports over the last 2 months she has noticed a sensation mid chest that is worse with a deep breath. She denies chest pain, "shortness of breath", nausea, fever, chills, LE tenderness, palpations or racing HR. Marland Kitchen She denies recent travel. She is on BCP. Has a h/o SUPERFICIAL thrombosis x1, 1 year ago. She has a h/o GERD, Allergies and Asthma. She has never been on controller inhalers. She has had to use intermittent albuterol, but not in a few years. She is not on an allergy regimen. She endorses occassional wheezing and worsening cough after exercise.   Health maintenance: Colonoscopy: No Fhx. Screen at 50.  Mammogram: No fhx, screen at 40 Cervical cancer screening: Follows with GYN routinely. E.Powell, NP [facility] [place]. Immunizations: tdap UTD 2015, Influenza  (encouraged yearly) Infectious disease screening: HIV completed   Depression screen [facility] 2/9 09/27/2016 10/16/2015  Decreased Interest 0 0  Down, Depressed, Hopeless 0 0  PHQ - 2 Score 0 0   No flowsheet data found.   Current Exercise Habits: Structured exercise class;Home exercise routine, Type of exercise: strength training/weights,  Time (Minutes): 45, Frequency (Times/Week): 3, Weekly Exercise (Minutes/Week): 135, Intensity: Moderate   Fall Risk  10/16/2015  Falls in the past year? No     There is no immunization history for the selected administration types on file for this patient.  No exam data present  Past Medical History:  Diagnosis Date  . Allergy   . Asthma   . Cough 06/07/2008   Qualifier: Diagnosis of  By: Diona Browner MD, Amy    . GERD (gastroesophageal reflux disease)   . History of chlamydia 06/2011   treated with doxy  . Superficial thrombophlebitis 09/2015   right LE   Allergies  Allergen Reactions  . Depo-Provera [Medroxyprogesterone] Other (See Comments)    Severe migraine and muscle spasms   Past Surgical History:  Procedure Laterality Date  . WISDOM TOOTH EXTRACTION     Family History  Problem Relation Age of Onset  . Breast cancer Neg Hx   . Colon cancer Neg Hx   . Heart disease Neg Hx    Social History   Social History  . Marital status: Single    Spouse name: N/A  . Number of children: 1  . Years of education: 33   Occupational History  . Server    Social History Main Topics  . Smoking status: Never Smoker  . Smokeless tobacco: Never Used  . Alcohol use 0.6 oz/week    1 Glasses of wine per week     Comment: every few weeks; but not while pregnant  . Drug use: No  . Sexual activity: Yes    Partners: Male  Birth control/ protection: None   Other Topics Concern  . Not on file   Social History Narrative   Single. 1 daughter Hadynn.    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    Drinks caffeine. Takes a daily vitamin.    Wears seatbelt, bicycle helmet. Smoke detector in the home.    Exercises routinely.    Feels safe in her relationships.          Allergies as of 09/27/2016      Reactions   Depo-provera [medroxyprogesterone] Other (See Comments)   Severe migraine and muscle spasms      Medication List       Accurate as of 09/27/16 11:20 AM. Always use your  most recent med list.          albuterol 108 (90 Base) MCG/ACT inhaler Commonly known as:  PROVENTIL HFA;VENTOLIN HFA Inhale 2 puffs into the lungs every 6 (six) hours as needed for wheezing or shortness of breath.   carbamide peroxide 6.5 % otic solution Commonly known as:  DEBROX 5 drops bilateral ears until cleared, then use every 2-4 weeks for maintenance.   cetirizine 10 MG tablet Commonly known as:  ZYRTEC Take 1 tablet (10 mg total) by mouth daily.   fluticasone 50 MCG/ACT nasal spray Commonly known as:  FLONASE Place 2 sprays into both nostrils daily.   NORLYDA 0.35 MG tablet Generic drug:  norethindrone Take 1 tablet by mouth daily.   predniSONE 20 MG tablet Commonly known as:  DELTASONE 60 mg x3d, 40 mg x3d, 20 mg x2d, 10 mg x2d       All past medical history, surgical history, allergies, family history, immunizations andmedications were updated in the EMR today and reviewed under the history and medication portions of their EMR.    No results found for this or any previous visit (from the past 2160 hour(s)).  US Venous Img Lower Unilateral Right  Result Date: 10/16/2015 CLINICAL DATA:  Two right medial knots 5 days. Right lower extremity pain. EXAM: RIGHT LOWER EXTREMITY VENOUS DOPPLER ULTRASOUND TECHNIQUE: Gray-scale sonography with graded compression, as well as color Doppler and duplex ultrasound were performed to evaluate the lower extremity deep venous systems from the level of the common femoral vein and including the common femoral, femoral, profunda femoral, popliteal and calf veins including the posterior tibial, peroneal and gastrocnemius veins when visible. The superficial great saphenous vein was also interrogated. Spectral Doppler was utilized to evaluate flow at rest and with distal augmentation maneuvers in the common femoral, femoral and popliteal veins. COMPARISON:  None. FINDINGS: Contralateral Common Femoral Vein: Respiratory phasicity is normal and  symmetric with the symptomatic side. No evidence of thrombus. Normal compressibility. Common Femoral Vein: No evidence of thrombus. Normal compressibility, respiratory phasicity and response to augmentation. Saphenofemoral Junction: No evidence of thrombus. Normal compressibility and flow on color Doppler imaging. Profunda Femoral Vein: No evidence of thrombus. Normal compressibility and flow on color Doppler imaging. Femoral Vein: No evidence of thrombus. Normal compressibility, respiratory phasicity and response to augmentation. Popliteal Vein: No evidence of thrombus. Normal compressibility, respiratory phasicity and response to augmentation. Calf Veins: No evidence of thrombus. Normal compressibility and flow on color Doppler imaging. Superficial Great Saphenous Vein: No evidence of thrombus. Normal compressibility and flow on color Doppler imaging. Venous Reflux:  None. Other Findings: There is thrombus within a superficial vein over the medial lower leg at the level of the knee with moderate bulbous appearance to this thrombosed superficial vein corresponding to  patient's palpable abnormality involving perforaters to the greater saphenous vein. IMPRESSION: No evidence of deep venous thrombosis. Thrombosis of superficial vein of medial lower extremity at the level of the knee with bulbous appearance to this thrombosed pain corresponding to patient's palpable abnormality. Electronically Signed   By: Marin Olp M.D.   On: 10/16/2015 21:18     ROS: 14 pt review of systems performed and negative (unless mentioned in an HPI)  Objective: BP 96/67 (BP Location: Right Arm, Patient Position: Sitting, Cuff Size: Normal)   Pulse 77   Temp 98.2 F (36.8 C)   Resp 20   Ht 5\' 2"  (1.575 m)   Wt 128 lb (58.1 kg)   LMP 09/24/2016   SpO2 100%   BMI 23.41 kg/m  Gen: Afebrile. No acute distress. Nontoxic in appearance, well-developed, well-nourished,  Pleasant caucasian female.  HENT: AT. [place]. Bilateral TM  unable to be visualized secondary to cerumen impaction.  MMM, no oral lesions. Bilateral nares with drainage, no erythema. Right nostril with Large nasal polyp present, obstructing.  Throat without erythema, ulcerations or exudates. no Cough on exam, no hoarseness on exam. Eyes:Pupils Equal Round Reactive to light, Extraocular movements intact,  Conjunctiva without redness, discharge or icterus. Neck/lymp/endocrine: Supple,no lymphadenopathy CV: RRR no murmur Chest: CTAB, no wheeze, rhonchi or crackles. Normal Respiratory effort. moderateAir movement. Abd: Soft. NTND. BS present.  Skin: no rashes, purpura or petechiae.  Neuro/Msk:  Normal gait. PERLA. EOMi. Alert. Oriented x3.   Psych: Normal affect, dress and demeanor. Normal speech. Normal thought content and judgment.  Assessment/plan: Saarah Dewing is a 26 y.o. female present for establish care Nasal polyp/seasonal allergies - start flonase daily, refer to ENT considering size of polyp. Pt unable to breath through right nostril. - fluticasone (FLONASE) 50 MCG/ACT nasal spray; Place 2 sprays into both nostrils daily.  Dispense: 16 g; Refill: 6 - cetirizine (ZYRTEC) 10 MG tablet; Take 1 tablet (10 mg total) by mouth daily.  Dispense: 30 tablet; Refill: 11 - Ambulatory referral to ENT  Mild intermittent asthma with acute exacerbation/Exercise-induced asthma - Start allergy regimen zyrtec, flonase daily.  - Albuterol Tx in office today improved symptoms.  - Prednisone taper prescribed - albuterol PRN and before exercise prescribed.  - cetirizine (ZYRTEC) 10 MG tablet; Take 1 tablet (10 mg total) by mouth daily.  Dispense: 30 tablet; Refill: 11 - predniSONE (DELTASONE) 20 MG tablet; 60 mg x3d, 40 mg x3d, 20 mg x2d, 10 mg x2d  Dispense: 18 tablet; Refill: 0 - albuterol (PROVENTIL HFA;VENTOLIN HFA) 108 (90 Base) MCG/ACT inhaler; Inhale 2 puffs into the lungs every 6 (six) hours as needed for wheezing or shortness of breath.  Dispense: 1  Inhaler; Refill: 2   Bilateral impacted cerumen - use daily until cleared, then every 2-4 weeks once for maintenance.  - carbamide peroxide (DEBROX) 6.5 % otic solution; 5 drops bilateral ears until cleared, then use every 2-4 weeks for maintenance.  Dispense: 15 mL; Refill: 2   Return in about 4 weeks (around 10/25/2016) for CPE- and f/u on asthma.  Note is dictated utilizing voice recognition software. Although note has been proof read prior to signing, occasional typographical errors still can be missed. If any questions arise, please do not hesitate to call for verification.  Electronically signed by: Howard Pouch, DO [place]

## 2016-09-27 NOTE — Patient Instructions (Addendum)
It was a pleasure to meet you today.  I will refer you to ENT for the nasal polyp finding for recommendations. The nasal steroid (flonase) may help improve this condition, but may need more investigation as well.   Start zyrtec and Flonase daily.  Prednisone taper. Albuterol inhaler as needed daily. Especially 15 minutes before exercise.    Nasal Polyps Nasal polyps are growths that form in the nose. Irritation and swelling (inflammation) in the nose or sinus openings can lead to changes in the tissue (mucosa) that lines these areas. Long-term inflammation causes the mucosa to balloon out or grow into a polyp. The polyp fills with watery mucus. Nasal polyps look like moist, gray grapes in the nose.Nasal polyps are not cancer. They do not increase your risk of cancer. You may have one nasal polyp or more than one. They can be small or large. In most cases, they form in both sides of the nose. Polyps can make it hard to breathe through your nose (nasal obstruction). What are the causes? The exact cause of nasal polyps is not known. What increases the risk? You are more likely to develop nasal polyps if you:  Have a family history of the condition.  Have a disease that causes inflammation in your nose or sinuses.  Have another condition that affects your nose or sinuses, such as:  Nasal allergies (allergic rhinitis).  Long-term nasal obstruction (nonallergic rhinitis).  Asthma.  Nasal or sinus infection, especially fungal infection.  Are female.  Are older than 24 years of age.  Have a sensitivity to aspirin or alcohol.  Smoke.  Have a disease passed down through families that causes increased production of thick mucus (cystic fibrosis). What are the signs or symptoms? Symptoms depend on the size of the polyps. Small polyps may cause few symptoms. When symptoms develop, they may include:  Nasal obstruction.  Decreased senses of smell and taste.  Runny nose.  The feeling of  mucus going down the back of the throat (postnasal drip).  Headache, face pain, or sinus pressure.  Snoring.  Frequent nasal or sinus infections.  Itchy eyes. How is this diagnosed?   Nasal polyps may be diagnosed based on your symptoms, your medical history, and a physical exam of the inside of your nose. You may also have tests, such as:  Imaging studies such as a CT scan or MRI to see how large your polyps are and if any are in your sinuses.  Skin or blood tests to find out if your polyps may be caused by allergies.  Washings or swabs taken from your nose to test for inflammation or infection. How is this treated? Small nasal polyps that are not causing symptoms may not need treatment. For large polyps that are causing symptoms, the goal of treatment is to reduce nasal obstruction and improve sinus drainage. Treatment may include:  A medicine to reduce inflammation (steroid). This is usually the first treatment. You may have to take steroids for a short or long period of time. Short-term steroids are usually taken as pills. Long-term steroid treatment is usually in the form of nose drops or spray.  Medicines to treat an underlying condition, such as allergies, asthma, or infection.  Surgery. This may be needed to remove nasal polyps if medicine does not help. Follow these instructions at home:  Take or use over-the-counter and prescription medicines only as told by your health care provider. Do not stop using your medicine even if you start to feel  better.  Use solutions to wash or rinse out the inside of your nose (nasal washes or irrigations) as told by your health care provider.  Do not take medicines that contain aspirin if they make your symptoms worse.  Do not drink alcohol if it makes your symptoms worse.  Do not use any tobacco products, such as cigarettes, chewing tobacco, and e-cigarettes. If you need help quitting, ask your health care provider.  Keep all follow-up  visits as told by your health care provider. This is important. Contact a health care provider if:  Your condition does not get better or it gets worse at home after treatment.  You have a fever.  You have headaches or pain in your face that is new or is getting worse.  You have a bloody nose. This information is not intended to replace advice given to you by your health care provider. Make sure you discuss any questions you have with your health care provider. Document Released: 09/04/2015 Document Revised: 10/19/2015 Document Reviewed: 08/03/2015 Elsevier Interactive Patient Education  2017 [place].   Please help [place] help you:  We are honored you have chosen [facility] for your Primary Care home. Below you will find basic instructions that you may need to access in the future. Please help [place] help you by reading the instructions, which cover many of the frequent questions we experience.   Prescription refills and request:  -In order to allow more efficient response time, please call your pharmacy for all refills. They will forward the request electronically to [place]. This allows for the quickest possible response. Request left on a nurse line can take longer to refill, since these are checked as time allows between office patients and other phone calls.  - refill request can take up to 3-5 working days to complete.  - If request is sent electronically and request is appropiate, it is usually completed in 1-2 business days.  - all patients will need to be seen routinely for all chronic medical conditions requiring prescription medications (see follow-up below). If you are overdue for follow up on your condition, you will be asked to make an appointment and we will call in enough medication to cover you until your appointment (up to 30 days).  - all controlled substances will require a face to face visit to request/refill.  - if you desire your prescriptions to go through a new  pharmacy, and have an active script at original pharmacy, you will need to call your pharmacy and have scripts transferred to new pharmacy. This is completed between the pharmacy locations and not by your provider.    Results: If any images or labs were ordered, it can take up to 1 week to get results depending on the test ordered and the lab/facility running and resulting the test. - Normal or stable results, which do not need further discussion, may be released to your mychart immediately with attached note to you. A call may not be generated for normal results. Please make certain to sign up for mychart. If you have questions on how to activate your mychart you can call the front office.  - If your results need further discussion, our office will attempt to contact you via phone, and if unable to reach you after 2 attempts, we will release your abnormal result to your mychart with instructions.  - All results will be automatically released in mychart after 1 week.  - Your provider will provide you with explanation  and instruction on all relevant material in your results. Please keep in mind, results and labs may appear confusing or abnormal to the untrained eye, but it does not mean they are actually abnormal for you personally. If you have any questions about your results that are not covered, or you desire more detailed explanation than what was provided, you should make an appointment with your provider to do so.   Our office handles many outgoing and incoming calls daily. If we have not contacted you within 1 week about your results, please check your mychart to see if there is a message first and if not, then contact our office.  In helping with this matter, you help decrease call volume, and therefore allow [place] to be able to respond to patients needs more efficiently.   Acute office visits (sick visit):  An acute visit is intended for a new problem and are scheduled in shorter time slots to allow  schedule openings for patients with new problems. This is the appropriate visit to discuss a new problem. In order to provide you with excellent quality medical care with proper time for you to explain your problem, have an exam and receive treatment with instructions, these appointments should be limited to one new problem per visit. If you experience a new problem, in which you desire to be addressed, please make an acute office visit, we save openings on the schedule to accommodate you. Please do not save your new problem for any other type of visit, let us take care of it properly and quickly for you.   Follow up visits:  Depending on your condition(s) your provider will need to see you routinely in order to provide you with quality care and prescribe medication(s). Most chronic conditions (Example: hypertension, Diabetes, depression/anxiety... etc), require visits a couple times a year. Your provider will instruct you on proper follow up for your personal medical conditions and history. Please make certain to make follow up appointments for your condition as instructed. Failing to do so could result in lapse in your medication treatment/refills. If you request a refill, and are overdue to be seen on a condition, we will always provide you with a 30 day script (once) to allow you time to schedule.    Medicare wellness (well visit): - we have a wonderful Nurse Maudie Mercury), that will meet with you and provide you will yearly medicare wellness visits. These visits should occur yearly (can not be scheduled less than 1 calendar year apart) and cover preventive health, immunizations, advance directives and screenings you are entitled to yearly through your medicare benefits. Do not miss out on your entitled benefits, this is when medicare will pay for these benefits to be ordered for you.  These are strongly encouraged by your provider and is the appropriate type of visit to make certain you are up to date with all  preventive health benefits. If you have not had your medicare wellness exam in the last 12 months, please make certain to schedule one by calling the office and schedule your medicare wellness with Maudie Mercury as soon as possible.   Yearly physical (well visit):  - Adults are recommended to be seen yearly for physicals. Check with your insurance and date of your last physical, most insurances require one calendar year between physicals. Physicals include all preventive health topics, screenings, medical exam and labs that are appropriate for gender/age and history. You may have fasting labs needed at this visit. This is a well visit (  not a sick visit), new problems should not be covered during this visit (see acute visit).  - Pediatric patients are seen more frequently when they are younger. Your provider will advise you on well child visit timing that is appropriate for your their age. - This is not a medicare wellness visit. Medicare wellness exams do not have an exam portion to the visit. Some medicare companies allow for a physical, some do not allow a yearly physical. If your medicare allows a yearly physical you can schedule the medicare wellness with our nurse Maudie Mercury and have your physical with your provider after, on the same day. Please check with insurance for your full benefits.   Late Policy/No Shows:  - all new patients should arrive 15-30 minutes earlier than appointment to allow [place] time  to  obtain all personal demographics,  insurance information and for you to complete office paperwork. - All established patients should arrive 10-15 minutes earlier than appointment time to update all information and be checked in .  - In our best efforts to run on time, if you are late for your appointment you will be asked to either reschedule or if able, we will work you back into the schedule. There will be a wait time to work you back in the schedule,  depending on availability.  - If you are unable to make it  to your appointment as scheduled, please call 24 hours ahead of time to allow [place] to fill the time slot with someone else who needs to be seen. If you do not cancel your appointment ahead of time, you may be charged a no show fee.

## 2016-10-25 ENCOUNTER — Ambulatory Visit (INDEPENDENT_AMBULATORY_CARE_PROVIDER_SITE_OTHER): Payer: PRIVATE HEALTH INSURANCE | Admitting: Family Medicine

## 2016-10-25 ENCOUNTER — Encounter: Payer: Self-pay | Admitting: Family Medicine

## 2016-10-25 VITALS — BP 92/62 | HR 84 | Temp 98.1°F | Resp 20 | Ht 62.0 in | Wt 128.8 lb

## 2016-10-25 DIAGNOSIS — H6123 Impacted cerumen, bilateral: Secondary | ICD-10-CM

## 2016-10-25 DIAGNOSIS — J4521 Mild intermittent asthma with (acute) exacerbation: Secondary | ICD-10-CM

## 2016-10-25 DIAGNOSIS — J301 Allergic rhinitis due to pollen: Secondary | ICD-10-CM

## 2016-10-25 DIAGNOSIS — I8390 Asymptomatic varicose veins of unspecified lower extremity: Secondary | ICD-10-CM

## 2016-10-25 DIAGNOSIS — Z0001 Encounter for general adult medical examination with abnormal findings: Secondary | ICD-10-CM

## 2016-10-25 DIAGNOSIS — Z0000 Encounter for general adult medical examination without abnormal findings: Secondary | ICD-10-CM | POA: Diagnosis not present

## 2016-10-25 DIAGNOSIS — J339 Nasal polyp, unspecified: Secondary | ICD-10-CM

## 2016-10-25 DIAGNOSIS — K5900 Constipation, unspecified: Secondary | ICD-10-CM | POA: Insufficient documentation

## 2016-10-25 LAB — CBC WITH DIFFERENTIAL/PLATELET
Basophils Absolute: 0 10*3/uL (ref 0.0–0.1)
Basophils Relative: 0.4 % (ref 0.0–3.0)
EOS PCT: 3.7 % (ref 0.0–5.0)
Eosinophils Absolute: 0.2 10*3/uL (ref 0.0–0.7)
HCT: 42.6 % (ref 36.0–46.0)
Hemoglobin: 14 g/dL (ref 12.0–15.0)
LYMPHS ABS: 2.5 10*3/uL (ref 0.7–4.0)
Lymphocytes Relative: 48.1 % — ABNORMAL HIGH (ref 12.0–46.0)
MCHC: 32.9 g/dL (ref 30.0–36.0)
MCV: 87.1 fl (ref 78.0–100.0)
MONO ABS: 0.7 10*3/uL (ref 0.1–1.0)
Monocytes Relative: 13.3 % — ABNORMAL HIGH (ref 3.0–12.0)
Neutro Abs: 1.8 10*3/uL (ref 1.4–7.7)
Neutrophils Relative %: 34.5 % — ABNORMAL LOW (ref 43.0–77.0)
Platelets: 276 10*3/uL (ref 150.0–400.0)
RBC: 4.89 Mil/uL (ref 3.87–5.11)
RDW: 14 % (ref 11.5–15.5)
WBC: 5.2 10*3/uL (ref 4.0–10.5)

## 2016-10-25 LAB — COMPREHENSIVE METABOLIC PANEL
ALBUMIN: 4.4 g/dL (ref 3.5–5.2)
ALK PHOS: 57 U/L (ref 39–117)
ALT: 17 U/L (ref 0–35)
AST: 23 U/L (ref 0–37)
BILIRUBIN TOTAL: 0.7 mg/dL (ref 0.2–1.2)
BUN: 9 mg/dL (ref 6–23)
CALCIUM: 9.5 mg/dL (ref 8.4–10.5)
CO2: 30 mEq/L (ref 19–32)
CREATININE: 0.78 mg/dL (ref 0.40–1.20)
Chloride: 107 mEq/L (ref 96–112)
GFR: 96.11 mL/min (ref >60.00)
Glucose, Bld: 80 mg/dL (ref 70–99)
Potassium: 4.2 mEq/L (ref 3.5–5.1)
Sodium: 141 mEq/L (ref 135–145)
TOTAL PROTEIN: 7 g/dL (ref 6.0–8.3)

## 2016-10-25 LAB — TSH: TSH: 2.69 u[IU]/mL (ref 0.35–4.50)

## 2016-10-25 NOTE — Progress Notes (Signed)
Patient ID: Julia Lyons, female  DOB: 1990-12-19, 26 y.o.   MRN: [id number] Patient Care Team    Relationship Specialty Notifications Start End  Ma Hillock, DO PCP - General Family Medicine  09/27/16   Earnstine Regal, PA-C Physician Assistant Obstetrics and Gynecology  09/27/16   Melida Quitter, MD Consulting Physician Otolaryngology  10/25/16     Chief Complaint  Patient presents with  . Annual Exam    Subjective:  Julia Lyons is a 26 y.o.  female present for CPE. All past medical history, surgical history, allergies, family history, immunizations, medications and social history were updated and entered in the electronic medical record today. All recent labs, ED visits and hospitalizations within the last year were reviewed.  Varicose vein: pt reports a bumpy area behind her right knee. She has a h/o superficial thrombophelbitis in the past in this area. She reports she accidentally found the area, it is not painful or red. She denies fever. Recent ravel, shortness of breath.   Asthma/nasal polyp: She reports she is much improved after treatment a few weeks ago with prednisone, and start of zyrtec and flonase. She has continued the zyrtec and flonase and feels she is able to breath a little better out of the right side of her nose, but still feels a difference between the two sides. She has been using the debrox solution daily.   Constipation: She endorses acute on chronic bowel changes. She believes she had the stomach bug a few days ago, and experienced abd cramping and diarrhea. It improved, until last night she again felt crampy and felt like she had to have a BM, but could not go. She usually has about 2-3 BM weekly, but admits she strains a little bit with movement. She sometimes has a hemorrhoid. She reports she does drink a good bit of water, but probably could do better. Her last formed BM was 5 days ago. She denies BRBPR.  Health maintenance: reviewed and  updated if changes 10/25/2016 Colonoscopy: No Fhx. Screen at 50.  Mammogram: No fhx, screen at 40 Cervical cancer screening: Follows with GYN routinely. E.Powell, NP [place]. Immunizations: tdap UTD 2015, Influenza  (encouraged yearly) Infectious disease screening: HIV completed No hospitalizations or ED visits. Pt has a dental home.    Depression screen [facility] 2/9 10/25/2016 09/27/2016 10/16/2015  Decreased Interest 0 0 0  Down, Depressed, Hopeless 0 0 0  PHQ - 2 Score 0 0 0   No flowsheet data found.   Current Exercise Habits: Home exercise routine;Structured exercise class, Type of exercise: strength training/weights, Time (Minutes): 45, Frequency (Times/Week): 3, Weekly Exercise (Minutes/Week): 135, Intensity: Moderate Exercise limited by: None identified Fall Risk  10/25/2016 10/16/2015  Falls in the past year? No No     There is no immunization history for the selected administration types on file for this patient.  No exam data present  Past Medical History:  Diagnosis Date  . Allergy   . Asthma   . Cough 06/07/2008   Qualifier: Diagnosis of  By: Diona Browner MD, Amy    . GERD (gastroesophageal reflux disease)   . History of chlamydia 06/2011   treated with doxy  . Superficial thrombophlebitis 09/2015   right LE   Allergies  Allergen Reactions  . Depo-Provera [Medroxyprogesterone] Other (See Comments)    Severe migraine and muscle spasms   Past Surgical History:  Procedure Laterality Date  . WISDOM TOOTH EXTRACTION     Family History  Problem Relation Age of Onset  . Breast cancer Neg Hx   . Colon cancer Neg Hx   . Heart disease Neg Hx    Social History   Social History  . Marital status: Single    Spouse name: N/A  . Number of children: 1  . Years of education: 53   Occupational History  . Server    Social History Main Topics  . Smoking status: Never Smoker  . Smokeless tobacco: Never Used  . Alcohol use 0.6 oz/week    1 Glasses of wine per week       Comment: every few weeks; but not while pregnant  . Drug use: No  . Sexual activity: Yes    Partners: Male    Birth control/ protection: None   Other Topics Concern  . Not on file   Social History Narrative   Single. 1 daughter Julia Lyons.    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    Drinks caffeine. Takes a daily vitamin.    Wears seatbelt, bicycle helmet. Smoke detector in the home.    Exercises routinely.    Feels safe in her relationships.          Allergies as of 10/25/2016      Reactions   Depo-provera [medroxyprogesterone] Other (See Comments)   Severe migraine and muscle spasms      Medication List       Accurate as of 10/25/16  5:46 PM. Always use your most recent med list.          albuterol 108 (90 Base) MCG/ACT inhaler Commonly known as:  PROVENTIL HFA;VENTOLIN HFA Inhale 2 puffs into the lungs every 6 (six) hours as needed for wheezing or shortness of breath.   carbamide peroxide 6.5 % otic solution Commonly known as:  DEBROX 5 drops bilateral ears until cleared, then use every 2-4 weeks for maintenance.   cetirizine 10 MG tablet Commonly known as:  ZYRTEC Take 1 tablet (10 mg total) by mouth daily.   fluticasone 50 MCG/ACT nasal spray Commonly known as:  FLONASE SHAKE LIQUID WELL AND USE 2 SPRAYS IN EACH NOSTRIL DAILY   NORLYDA 0.35 MG tablet Generic drug:  norethindrone Take 1 tablet by mouth daily.       All past medical history, surgical history, allergies, family history, immunizations andmedications were updated in the EMR today and reviewed under the history and medication portions of their EMR.    Recent Results (from the past 2160 hour(s))  CBC w/Diff     Status: Abnormal   Collection Time: 10/25/16 11:11 AM  Result Value Ref Range   WBC 5.2 4.0 - 10.5 K/uL   RBC 4.89 3.87 - 5.11 Mil/uL   Hemoglobin 14.0 12.0 - 15.0 g/dL   HCT 42.6 36.0 - 46.0 %   MCV 87.1 78.0 - 100.0 fl   MCHC 32.9 30.0 - 36.0 g/dL   RDW 14.0 11.5 - 15.5 %    Platelets 276.0 150.0 - 400.0 K/uL   Neutrophils Relative % 34.5 (L) 43.0 - 77.0 %   Lymphocytes Relative 48.1 (H) 12.0 - 46.0 %   Monocytes Relative 13.3 (H) 3.0 - 12.0 %   Eosinophils Relative 3.7 0.0 - 5.0 %   Basophils Relative 0.4 0.0 - 3.0 %   Neutro Abs 1.8 1.4 - 7.7 K/uL   Lymphs Abs 2.5 0.7 - 4.0 K/uL   Monocytes Absolute 0.7 0.1 - 1.0 K/uL   Eosinophils Absolute 0.2 0.0 - 0.7 K/uL  Basophils Absolute 0.0 0.0 - 0.1 K/uL  Comp Met (CMET)     Status: None   Collection Time: 10/25/16 11:11 AM  Result Value Ref Range   Sodium 141 135 - 145 mEq/L   Potassium 4.2 3.5 - 5.1 mEq/L   Chloride 107 96 - 112 mEq/L   CO2 30 19 - 32 mEq/L   Glucose, Bld 80 70 - 99 mg/dL   BUN 9 6 - 23 mg/dL   Creatinine, Ser 0.78 0.40 - 1.20 mg/dL   Total Bilirubin 0.7 0.2 - 1.2 mg/dL   Alkaline Phosphatase 57 39 - 117 U/L   AST 23 0 - 37 U/L   ALT 17 0 - 35 U/L   Total Protein 7.0 6.0 - 8.3 g/dL   Albumin 4.4 3.5 - 5.2 g/dL   Calcium 9.5 8.4 - 10.5 mg/dL   GFR 96.11 >60.00 mL/min  TSH     Status: None   Collection Time: 10/25/16 11:11 AM  Result Value Ref Range   TSH 2.69 0.35 - 4.50 uIU/mL    US Venous Img Lower Unilateral Right  Result Date: 10/16/2015 CLINICAL DATA:  Two right medial knots 5 days. Right lower extremity pain. EXAM: RIGHT LOWER EXTREMITY VENOUS DOPPLER ULTRASOUND TECHNIQUE: Gray-scale sonography with graded compression, as well as color Doppler and duplex ultrasound were performed to evaluate the lower extremity deep venous systems from the level of the common femoral vein and including the common femoral, femoral, profunda femoral, popliteal and calf veins including the posterior tibial, peroneal and gastrocnemius veins when visible. The superficial great saphenous vein was also interrogated. Spectral Doppler was utilized to evaluate flow at rest and with distal augmentation maneuvers in the common femoral, femoral and popliteal veins. COMPARISON:  None. FINDINGS: Contralateral  Common Femoral Vein: Respiratory phasicity is normal and symmetric with the symptomatic side. No evidence of thrombus. Normal compressibility. Common Femoral Vein: No evidence of thrombus. Normal compressibility, respiratory phasicity and response to augmentation. Saphenofemoral Junction: No evidence of thrombus. Normal compressibility and flow on color Doppler imaging. Profunda Femoral Vein: No evidence of thrombus. Normal compressibility and flow on color Doppler imaging. Femoral Vein: No evidence of thrombus. Normal compressibility, respiratory phasicity and response to augmentation. Popliteal Vein: No evidence of thrombus. Normal compressibility, respiratory phasicity and response to augmentation. Calf Veins: No evidence of thrombus. Normal compressibility and flow on color Doppler imaging. Superficial Great Saphenous Vein: No evidence of thrombus. Normal compressibility and flow on color Doppler imaging. Venous Reflux:  None. Other Findings: There is thrombus within a superficial vein over the medial lower leg at the level of the knee with moderate bulbous appearance to this thrombosed superficial vein corresponding to patient's palpable abnormality involving perforaters to the greater saphenous vein. IMPRESSION: No evidence of deep venous thrombosis. Thrombosis of superficial vein of medial lower extremity at the level of the knee with bulbous appearance to this thrombosed pain corresponding to patient's palpable abnormality. Electronically Signed   By: Marin Olp M.D.   On: 10/16/2015 21:18     ROS: 14 pt review of systems performed and negative (unless mentioned in an HPI)  Objective: BP 92/62 (BP Location: Right Arm, Patient Position: Sitting, Cuff Size: Normal)   Pulse 84   Temp 98.1 F (36.7 C)   Resp 20   Ht '5\' 2"'  (1.575 m)   Wt 128 lb 12 oz (58.4 kg)   LMP 10/21/2016   SpO2 98%   BMI 23.55 kg/m  Gen: Afebrile. No acute distress. Nontoxic in  appearance, well developed, well  nourished.  HENT: AT. [place]. Bilateral TM visualized and normal in appearance. MMM. Bilateral nares without erythema, less swelling than prior, nasal polyp appears mildly smaller but still present right nare. Throat without erythema or exudates. No cough, no hoarseness. Eyes:Pupils Equal Round Reactive to light, Extraocular movements intact,  Conjunctiva without redness, discharge or icterus. Neck/lymp/endocrine: Supple, no lymphadenopathy, no thyromegaly CV: RRR no murmur, no edema, +2/4 P posterior tibialis pulses Chest: CTAB, no wheeze or crackles Abd: Soft. Flat. NTND. BS present. No Masses palpated, moderate stool burden palpated. MSK: No erythema, no soft tissue swelling, full range of motion, no obvious deformities. Varicosities present posterior medial right knee. Skin: No rashes, purpura or petechiae. Warm and well-perfused, intact. Neuro:  Normal gait. PERLA. EOMi. Alert. Oriented. Cranial nerves II through XII intact. Muscle strength 5/5 upper and lower extremity. DTRs equal bilaterally. Psych: Normal affect, dress and demeanor. Normal speech. Normal thought content and judgment.   Assessment/plan: Rie Mcneil is a 26 y.o. female present for CPE with complaints. Nasal polyp/seasonal allergies - Continue flonase daily, Zyrtec, and ENT appointment next week.  - She has seen some mild improvement, decrease swelling with the use of Zyrtec and Flonase, as well as prednisone burst.  Mild intermittent asthma with acute exacerbation/Exercise-induced asthma - Continue allergy regimen, only has needed albuterol 3 times since seen last, and that was more with her acute illness. Continue albuterol 15 minutes prior to exercise.  - Follow yearly  Bilateral impacted cerumen - Still present today. Continue the proximal solution daily. She can also discuss with ENT if desired.   Encounter for general adult medical examination with abnormal findings - Patient was provided with AVS  information on health maintenance, constipation and varicose veins today. She was encouraged to exercise greater than 150 minutes a week. Consume a healthy diet. - CBC w/Diff - Comp Met (CMET) - Immunizations are up-to-date. - Patient has gynecologist, Pap is up-to-date.  Mild intermittent asthma with acute exacerbation - Better controlled with allergy regimen - Albuterol when necessary  Constipation, unspecified constipation type - Increased water consumption to 80 ounces a day. - Start probiotic daily. - TSH - Exercise. - Discussed normal bowel habits and constipation. - If the above doesn't improve condition, start MiraLAX taper. This was explained to her today.  Varicose vein of leg - Area behind her knee appears to be a varicose vein. This is nontender not inflamed. Discussed the use of support stockings, not sitting on bent knee (which she does frequently), attempt to avoid heavy lifting. - Discussed possible treatments if area becomes more of a concern. Discuss emergent symptoms.   Return in about 1 year (around 10/25/2017) for CPE.  Note is dictated utilizing voice recognition software. Although note has been proof read prior to signing, occasional typographical errors still can be missed. If any questions arise, please do not hesitate to call for verification.  Electronically signed by: Howard Pouch, DO [place]

## 2016-10-25 NOTE — Patient Instructions (Addendum)
Health Maintenance, Female Adopting a healthy lifestyle and getting preventive care can go a long way to promote health and wellness. Talk with your health care provider about what schedule of regular examinations is right for you. This is a good chance for you to check in with your provider about disease prevention and staying healthy. In between checkups, there are plenty of things you can do on your own. Experts have done a lot of research about which lifestyle changes and preventive measures are most likely to keep you healthy. Ask your health care provider for more information. Weight and diet Eat a healthy diet  Be sure to include plenty of vegetables, fruits, low-fat dairy products, and lean protein.  Do not eat a lot of foods high in solid fats, added sugars, or salt.  Get regular exercise. This is one of the most important things you can do for your health. ? Most adults should exercise for at least 150 minutes each week. The exercise should increase your heart rate and make you sweat (moderate-intensity exercise). ? Most adults should also do strengthening exercises at least twice a week. This is in addition to the moderate-intensity exercise.  Maintain a healthy weight  Body mass index (BMI) is a measurement that can be used to identify possible weight problems. It estimates body fat based on height and weight. Your health care provider can help determine your BMI and help you achieve or maintain a healthy weight.  For females 20 years of age and older: ? A BMI below 18.5 is considered underweight. ? A BMI of 18.5 to 24.9 is normal. ? A BMI of 25 to 29.9 is considered overweight. ? A BMI of 30 and above is considered obese.  Watch levels of cholesterol and blood lipids  You should start having your blood tested for lipids and cholesterol at 24 years of age, then have this test every 5 years.  You may need to have your cholesterol levels checked more often if: ? Your lipid or  cholesterol levels are high. ? You are older than 24 years of age. ? You are at high risk for heart disease.  Cancer screening Lung Cancer  Lung cancer screening is recommended for adults 55-80 years old who are at high risk for lung cancer because of a history of smoking.  A yearly low-dose CT scan of the lungs is recommended for people who: ? Currently smoke. ? Have quit within the past 15 years. ? Have at least a 30-pack-year history of smoking. A pack year is smoking an average of one pack of cigarettes a day for 1 year.  Yearly screening should continue until it has been 15 years since you quit.  Yearly screening should stop if you develop a health problem that would prevent you from having lung cancer treatment.  Breast Cancer  Practice breast self-awareness. This means understanding how your breasts normally appear and feel.  It also means doing regular breast self-exams. Let your health care provider know about any changes, no matter how small.  If you are in your 20s or 30s, you should have a clinical breast exam (CBE) by a health care provider every 1-3 years as part of a regular health exam.  If you are 40 or older, have a CBE every year. Also consider having a breast X-ray (mammogram) every year.  If you have a family history of breast cancer, talk to your health care provider about genetic screening.  If you are at high risk   for breast cancer, talk to your health care provider about having an MRI and a mammogram every year.  Breast cancer gene (BRCA) assessment is recommended for women who have family members with BRCA-related cancers. BRCA-related cancers include: ? Breast. ? Ovarian. ? Tubal. ? Peritoneal cancers.  Results of the assessment will determine the need for genetic counseling and BRCA1 and BRCA2 testing.  Cervical Cancer Your health care provider may recommend that you be screened regularly for cancer of the pelvic organs (ovaries, uterus, and  vagina). This screening involves a pelvic examination, including checking for microscopic changes to the surface of your cervix (Pap test). You may be encouraged to have this screening done every 3 years, beginning at age 80.  For women ages 58-65, health care providers may recommend pelvic exams and Pap testing every 3 years, or they may recommend the Pap and pelvic exam, combined with testing for human papilloma virus (HPV), every 5 years. Some types of HPV increase your risk of cervical cancer. Testing for HPV may also be done on women of any age with unclear Pap test results.  Other health care providers may not recommend any screening for nonpregnant women who are considered low risk for pelvic cancer and who do not have symptoms. Ask your health care provider if a screening pelvic exam is right for you.  If you have had past treatment for cervical cancer or a condition that could lead to cancer, you need Pap tests and screening for cancer for at least 20 years after your treatment. If Pap tests have been discontinued, your risk factors (such as having a new sexual partner) need to be reassessed to determine if screening should resume. Some women have medical problems that increase the chance of getting cervical cancer. In these cases, your health care provider may recommend more frequent screening and Pap tests.  Colorectal Cancer  This type of cancer can be detected and often prevented.  Routine colorectal cancer screening usually begins at 24 years of age and continues through 24 years of age.  Your health care provider may recommend screening at an earlier age if you have risk factors for colon cancer.  Your health care provider may also recommend using home test kits to check for hidden blood in the stool.  A small camera at the end of a tube can be used to examine your colon directly (sigmoidoscopy or colonoscopy). This is done to check for the earliest forms of colorectal  cancer.  Routine screening usually begins at age 53.  Direct examination of the colon should be repeated every 5-10 years through 24 years of age. However, you may need to be screened more often if early forms of precancerous polyps or small growths are found.  Skin Cancer  Check your skin from head to toe regularly.  Tell your health care provider about any new moles or changes in moles, especially if there is a change in a mole's shape or color.  Also tell your health care provider if you have a mole that is larger than the size of a pencil eraser.  Always use sunscreen. Apply sunscreen liberally and repeatedly throughout the day.  Protect yourself by wearing long sleeves, pants, a wide-brimmed hat, and sunglasses whenever you are outside.  Heart disease, diabetes, and high blood pressure  High blood pressure causes heart disease and increases the risk of stroke. High blood pressure is more likely to develop in: ? People who have blood pressure in the high end of  the normal range (130-139/85-89 mm Hg). ? People who are overweight or obese. ? People who are African American.  If you are 21-29 years of age, have your blood pressure checked every 3-5 years. If you are 3 years of age or older, have your blood pressure checked every year. You should have your blood pressure measured twice-once when you are at a hospital or clinic, and once when you are not at a hospital or clinic. Record the average of the two measurements. To check your blood pressure when you are not at a hospital or clinic, you can use: ? An automated blood pressure machine at a pharmacy. ? A home blood pressure monitor.  If you are between 17 years and 37 years old, ask your health care provider if you should take aspirin to prevent strokes.  Have regular diabetes screenings. This involves taking a blood sample to check your fasting blood sugar level. ? If you are at a normal weight and have a low risk for diabetes,  have this test once every three years after 24 years of age. ? If you are overweight and have a high risk for diabetes, consider being tested at a younger age or more often. Preventing infection Hepatitis B  If you have a higher risk for hepatitis B, you should be screened for this virus. You are considered at high risk for hepatitis B if: ? You were born in a country where hepatitis B is common. Ask your health care provider which countries are considered high risk. ? Your parents were born in a high-risk country, and you have not been immunized against hepatitis B (hepatitis B vaccine). ? You have HIV or AIDS. ? You use needles to inject street drugs. ? You live with someone who has hepatitis B. ? You have had sex with someone who has hepatitis B. ? You get hemodialysis treatment. ? You take certain medicines for conditions, including cancer, organ transplantation, and autoimmune conditions.  Hepatitis C  Blood testing is recommended for: ? Everyone born from [birth year] through 1963. ? Anyone with known risk factors for hepatitis C.  Sexually transmitted infections (STIs)  You should be screened for sexually transmitted infections (STIs) including gonorrhea and chlamydia if: ? You are sexually active and are younger than 24 years of age. ? You are older than 24 years of age and your health care provider tells you that you are at risk for this type of infection. ? Your sexual activity has changed since you were last screened and you are at an increased risk for chlamydia or gonorrhea. Ask your health care provider if you are at risk.  If you do not have HIV, but are at risk, it may be recommended that you take a prescription medicine daily to prevent HIV infection. This is called pre-exposure prophylaxis (PrEP). You are considered at risk if: ? You are sexually active and do not regularly use condoms or know the HIV status of your partner(s). ? You take drugs by injection. ? You are  sexually active with a partner who has HIV.  Talk with your health care provider about whether you are at high risk of being infected with HIV. If you choose to begin PrEP, you should first be tested for HIV. You should then be tested every 3 months for as long as you are taking PrEP. Pregnancy  If you are premenopausal and you may become pregnant, ask your health care provider about preconception counseling.  If you may become  pregnant, take 400 to 800 micrograms (mcg) of folic acid every day.  If you want to prevent pregnancy, talk to your health care provider about birth control (contraception). Osteoporosis and menopause  Osteoporosis is a disease in which the bones lose minerals and strength with aging. This can result in serious bone fractures. Your risk for osteoporosis can be identified using a bone density scan.  If you are 38 years of age or older, or if you are at risk for osteoporosis and fractures, ask your health care provider if you should be screened.  Ask your health care provider whether you should take a calcium or vitamin D supplement to lower your risk for osteoporosis.  Menopause may have certain physical symptoms and risks.  Hormone replacement therapy may reduce some of these symptoms and risks. Talk to your health care provider about whether hormone replacement therapy is right for you. Follow these instructions at home:  Schedule regular health, dental, and eye exams.  Stay current with your immunizations.  Do not use any tobacco products including cigarettes, chewing tobacco, or electronic cigarettes.  If you are pregnant, do not drink alcohol.  If you are breastfeeding, limit how much and how often you drink alcohol.  Limit alcohol intake to no more than 1 drink per day for nonpregnant women. One drink equals 12 ounces of beer, 5 ounces of wine, or 1 ounces of hard liquor.  Do not use street drugs.  Do not share needles.  Ask your health care  provider for help if you need support or information about quitting drugs.  Tell your health care provider if you often feel depressed.  Tell your health care provider if you have ever been abused or do not feel safe at home. This information is not intended to replace advice given to you by your health care provider. Make sure you discuss any questions you have with your health care provider. Document Released: 11/26/2010 Document Revised: 10/19/2015 Document Reviewed: 02/14/2015 Elsevier Interactive Patient Education  2018 [employer].    Please help [place] help you:  We are honored you have chosen [facility] for your Primary Care home. Below you will find basic instructions that you may need to access in the future. Please help [place] help you by reading the instructions, which cover many of the frequent questions we experience.   Prescription refills and request:  -In order to allow more efficient response time, please call your pharmacy for all refills. They will forward the request electronically to [place]. This allows for the quickest possible response. Request left on a nurse line can take longer to refill, since these are checked as time allows between office patients and other phone calls.  - refill request can take up to 3-5 working days to complete.  - If request is sent electronically and request is appropiate, it is usually completed in 1-2 business days.  - all patients will need to be seen routinely for all chronic medical conditions requiring prescription medications (see follow-up below). If you are overdue for follow up on your condition, you will be asked to make an appointment and we will call in enough medication to cover you until your appointment (up to 30 days).  - all controlled substances will require a face to face visit to request/refill.  - if you desire your prescriptions to go through a new pharmacy, and have an active script at original pharmacy, you will need to  call your pharmacy and have scripts transferred  to new pharmacy. This is completed between the pharmacy locations and not by your provider.    Results: If any images or labs were ordered, it can take up to 1 week to get results depending on the test ordered and the lab/facility running and resulting the test. - Normal or stable results, which do not need further discussion, may be released to your mychart immediately with attached note to you. A call may not be generated for normal results. Please make certain to sign up for mychart. If you have questions on how to activate your mychart you can call the front office.  - If your results need further discussion, our office will attempt to contact you via phone, and if unable to reach you after 2 attempts, we will release your abnormal result to your mychart with instructions.  - All results will be automatically released in mychart after 1 week.  - Your provider will provide you with explanation and instruction on all relevant material in your results. Please keep in mind, results and labs may appear confusing or abnormal to the untrained eye, but it does not mean they are actually abnormal for you personally. If you have any questions about your results that are not covered, or you desire more detailed explanation than what was provided, you should make an appointment with your provider to do so.   Our office handles many outgoing and incoming calls daily. If we have not contacted you within 1 week about your results, please check your mychart to see if there is a message first and if not, then contact our office.  In helping with this matter, you help decrease call volume, and therefore allow us to be able to respond to patients needs more efficiently.   Acute office visits (sick visit):  An acute visit is intended for a new problem and are scheduled in shorter time slots to allow schedule openings for patients with new problems. This is the appropriate  visit to discuss a new problem. In order to provide you with excellent quality medical care with proper time for you to explain your problem, have an exam and receive treatment with instructions, these appointments should be limited to one new problem per visit. If you experience a new problem, in which you desire to be addressed, please make an acute office visit, we save openings on the schedule to accommodate you. Please do not save your new problem for any other type of visit, let us take care of it properly and quickly for you.   Follow up visits:  Depending on your condition(s) your provider will need to see you routinely in order to provide you with quality care and prescribe medication(s). Most chronic conditions (Example: hypertension, Diabetes, depression/anxiety... etc), require visits a couple times a year. Your provider will instruct you on proper follow up for your personal medical conditions and history. Please make certain to make follow up appointments for your condition as instructed. Failing to do so could result in lapse in your medication treatment/refills. If you request a refill, and are overdue to be seen on a condition, we will always provide you with a 30 day script (once) to allow you time to schedule.    Medicare wellness (well visit): - we have a wonderful Nurse (Kim), that will meet with you and provide you will yearly medicare wellness visits. These visits should occur yearly (can not be scheduled less than 1 calendar year apart) and cover preventive health, immunizations, advance directives   and screenings you are entitled to yearly through your medicare benefits. Do not miss out on your entitled benefits, this is when medicare will pay for these benefits to be ordered for you.  These are strongly encouraged by your provider and is the appropriate type of visit to make certain you are up to date with all preventive health benefits. If you have not had your medicare wellness exam  in the last 12 months, please make certain to schedule one by calling the office and schedule your medicare wellness with Kim as soon as possible.   Yearly physical (well visit):  - Adults are recommended to be seen yearly for physicals. Check with your insurance and date of your last physical, most insurances require one calendar year between physicals. Physicals include all preventive health topics, screenings, medical exam and labs that are appropriate for gender/age and history. You may have fasting labs needed at this visit. This is a well visit (not a sick visit), new problems should not be covered during this visit (see acute visit).  - Pediatric patients are seen more frequently when they are younger. Your provider will advise you on well child visit timing that is appropriate for your their age. - This is not a medicare wellness visit. Medicare wellness exams do not have an exam portion to the visit. Some medicare companies allow for a physical, some do not allow a yearly physical. If your medicare allows a yearly physical you can schedule the medicare wellness with our nurse Kim and have your physical with your provider after, on the same day. Please check with insurance for your full benefits.   Late Policy/No Shows:  - all new patients should arrive 15-30 minutes earlier than appointment to allow us time  to  obtain all personal demographics,  insurance information and for you to complete office paperwork. - All established patients should arrive 10-15 minutes earlier than appointment time to update all information and be checked in .  - In our best efforts to run on time, if you are late for your appointment you will be asked to either reschedule or if able, we will work you back into the schedule. There will be a wait time to work you back in the schedule,  depending on availability.  - If you are unable to make it to your appointment as scheduled, please call 24 hours ahead of time to allow  us to fill the time slot with someone else who needs to be seen. If you do not cancel your appointment ahead of time, you may be charged a no show fee.   Varicose Veins Varicose veins are veins that have become enlarged and twisted. They are usually seen in the legs but can occur in other parts of the body as well. What are the causes? This condition is the result of valves in the veins not working properly. Valves in the veins help to return blood from the leg to the heart. If these valves are damaged, blood flows backward and backs up into the veins in the leg near the skin. This causes the veins to become larger. What increases the risk? People who are on their feet a lot, who are pregnant, or who are overweight are more likely to develop varicose veins. What are the signs or symptoms?  Bulging, twisted-appearing, bluish veins, most commonly found on the legs.  Leg pain or a feeling of heaviness. These symptoms may be worse at the end of the day.    Leg swelling.  Changes in skin color. How is this diagnosed? A health care provider can usually diagnose varicose veins by examining your legs. Your health care provider may also recommend an ultrasound of your leg veins. How is this treated? Most varicose veins can be treated at home.However, other treatments are available for people who have persistent symptoms or want to improve the cosmetic appearance of the varicose veins. These treatment options include:  Sclerotherapy. A solution is injected into the vein to close it off.  Laser treatment. A laser is used to heat the vein to close it off.  Radiofrequency vein ablation. An electrical current produced by radio waves is used to close off the vein.  Phlebectomy. The vein is surgically removed through small incisions made over the varicose vein.  Vein ligation and stripping. The vein is surgically removed through incisions made over the varicose vein after the vein has been tied  (ligated).  Follow these instructions at home:  Do not stand or sit in one position for long periods of time. Do not sit with your legs crossed. Rest with your legs raised during the day.  Wear compression stockings as directed by your health care provider. These stockings help to prevent blood clots and reduce swelling in your legs.  Do not wear other tight, encircling garments around your legs, pelvis, or waist.  Walk as much as possible to increase blood flow.  Raise the foot of your bed at night with 2-inch blocks.  If you get a cut in the skin over the vein and the vein bleeds, lie down with your leg raised and press on it with a clean cloth until the bleeding stops. Then place a bandage (dressing) on the cut. See your health care provider if it continues to bleed. Contact a health care provider if:  The skin around your ankle starts to break down.  You have pain, redness, tenderness, or hard swelling in your leg over a vein.  You are uncomfortable because of leg pain. This information is not intended to replace advice given to you by your health care provider. Make sure you discuss any questions you have with your health care provider. Document Released: 02/20/2005 Document Revised: 10/19/2015 Document Reviewed: 11/14/2015 Elsevier Interactive Patient Education  2017 [employer]    Align- probiotic daily Continue Zyrtec and flonase.  Albuterol before exercise, and if ill (URI). Increase water to 80 ounces a day.  Miralax taper in water daily.  Baby aspirin is ok.   

## 2016-10-28 ENCOUNTER — Telehealth: Payer: Self-pay | Admitting: Family Medicine

## 2016-10-28 NOTE — Telephone Encounter (Signed)
Left message with lab results on patient voice mail per DPR. 

## 2016-10-28 NOTE — Telephone Encounter (Signed)
Please call pt: - her labs are normal.

## 2016-12-04 ENCOUNTER — Ambulatory Visit (INDEPENDENT_AMBULATORY_CARE_PROVIDER_SITE_OTHER): Payer: PRIVATE HEALTH INSURANCE | Admitting: Family Medicine

## 2016-12-04 VITALS — BP 101/68 | HR 78 | Temp 98.0°F | Resp 20 | Wt 130.8 lb

## 2016-12-04 DIAGNOSIS — K625 Hemorrhage of anus and rectum: Secondary | ICD-10-CM | POA: Diagnosis not present

## 2016-12-04 DIAGNOSIS — K649 Unspecified hemorrhoids: Secondary | ICD-10-CM | POA: Insufficient documentation

## 2016-12-04 LAB — POC HEMOCCULT BLD/STL (OFFICE/1-CARD/DIAGNOSTIC): FECAL OCCULT BLD: NEGATIVE

## 2016-12-04 MED ORDER — HYDROCORTISONE ACETATE 25 MG RE SUPP: 25.0000 mg | Freq: Two times a day (BID) | RECTAL | 0 refills | Status: DC

## 2016-12-04 NOTE — Patient Instructions (Signed)
I think this is from a hemorrhoid. I have prescribed suppositories for you to try, usually take a few days of use to help resolve.   Keep stool thin/soft, use miralax to help with this.  About Hemorrhoids  Hemorrhoids are swollen veins in the lower rectum and anus.  Also called piles, hemorrhoids are a common problem.  Hemorrhoids may be internal (inside the rectum) or external (around the anus).  Internal Hemorrhoids  Internal hemorrhoids are often painless, but they rarely cause bleeding.  The internal veins may stretch and fall down (prolapse) through the anus to the outside of the body.  The veins may then become irritated and painful.  External Hemorrhoids  External hemorrhoids can be easily seen or felt around the anal opening.  They are under the skin around the anus.  When the swollen veins are scratched or broken by straining, rubbing or wiping they sometimes bleed.  How Hemorrhoids Occur  Veins in the rectum and around the anus tend to swell under pressure.  Hemorrhoids can result from increased pressure in the veins of your anus or rectum.  Some sources of pressure are:   Straining to have a bowel movement because of constipation  Waiting too long to have a bowel movement  Coughing and sneezing often  Sitting for extended periods of time, including on the toilet  Diarrhea  Obesity  Trauma or injury to the anus  Some liver diseases  Stress  Family history of hemorrhoids  Pregnancy  Pregnant women should try to avoid becoming constipated, because they are more likely to have hemorrhoids during pregnancy.  In the last trimester of pregnancy, the enlarged uterus may press on blood vessels and causes hemorrhoids.  In addition, the strain of childbirth sometimes causes hemorrhoids after the birth.  Symptoms of Hemorrhoids  Some symptoms of hemorrhoids include:  Swelling and/or a tender lump around the anus  Itching, mild burning and bleeding around the  anus  Painful bowel movements with or without constipation  Bright red blood covering the stool, on toilet paper or in the toilet bowel.   Symptoms usually go away within a few days.  Always talk to your doctor about any bleeding to make sure it is not from some other causes.  Diagnosing and Treating Hemorrhoids  Diagnosis is made by an examination by your healthcare provider.  Special test can be performed by your doctor.    Most cases of hemorrhoids can be treated with:  High-fiber diet: Eat more high-fiber foods, which help prevent constipation.  Ask for more detailed fiber information on types and sources of fiber from your healthcare provider.  Fluids: Drink plenty of water.  This helps soften bowel movements so they are easier to pass.  Sitz baths and cold packs: Sitting in lukewarm water two or three times a day for 15 minutes cleases the anal area and may relieve discomfort.  If the water is too hot, swelling around the anus will get worse.  Placing a cloth-covered ice pack on the anus for ten minutes four times a day can also help reduce selling.  Gently pushing a prolapsed hemorrhoid back inside after the bath or ice pack can be helpful.  Medications: For mild discomfort, your healthcare provider may suggest over-the-counter pain medication or prescribe a cream or ointment for topical use.  The cream may contain witch hazel, zinc oxide or petroleum jelly.  Medicated suppositories are also a treatment option.  Always consult your doctor before applying medications or creams.  Procedures and surgeries: There are also a number of procedures and surgeries to shrink or remove hemorrhoids in more serious cases.  Talk to your physician about these options.  You can often prevent hemorrhoids or keep them from becoming worse by maintaining a healthy lifestyle.  Eat a fiber-rich diet of fruits, vegetables and whole grains.  Also, drink plenty of water and exercise regularly.   2007,  [employer] Doc.30

## 2016-12-04 NOTE — Progress Notes (Signed)
Julia Lyons , Apr 28, 1991, 26 y.o., female MRN: [id number] Patient Care Team    Relationship Specialty Notifications Start End  Ma Hillock, DO PCP - General Family Medicine  09/27/16   Earnstine Regal, PA-C Physician Assistant Obstetrics and Gynecology  09/27/16   Melida Quitter, MD Consulting Physician Otolaryngology  10/25/16     Chief Complaint  Patient presents with  . Blood In Stools     Subjective: Pt presents for an OV with complaints of Rectal bleeding of 4 days  duration.  Associated symptoms include mild discomfort. She denies fever, chills, nausea, vomit Or dizziness. She does strain on occassions with her BM, nothing recently significant. She reports the blood is on her toilet tissue and in the toilet bowl. She does have a h/o of hemorrhoids after her daughter's birth. She does some heavy lifting with work and lifts her child.  Pt has tried nothing to ease their symptoms.   Depression screen [facility] 2/9 10/25/2016 09/27/2016 10/16/2015  Decreased Interest 0 0 0  Down, Depressed, Hopeless 0 0 0  PHQ - 2 Score 0 0 0    Allergies  Allergen Reactions  . Depo-Provera [Medroxyprogesterone] Other (See Comments)    Severe migraine and muscle spasms   Social History  Substance Use Topics  . Smoking status: Never Smoker  . Smokeless tobacco: Never Used  . Alcohol use 0.6 oz/week    1 Glasses of wine per week     Comment: every few weeks; but not while pregnant   Past Medical History:  Diagnosis Date  . Allergy   . Asthma   . Cough 06/07/2008   Qualifier: Diagnosis of  By: Diona Browner MD, Amy    . GERD (gastroesophageal reflux disease)   . History of chlamydia 06/2011   treated with doxy  . Superficial thrombophlebitis 09/2015   right LE   Past Surgical History:  Procedure Laterality Date  . WISDOM TOOTH EXTRACTION     Family History  Problem Relation Age of Onset  . Breast cancer Neg Hx   . Colon cancer Neg Hx   . Heart disease Neg Hx    Allergies as of 12/04/2016        Reactions   Depo-provera [medroxyprogesterone] Other (See Comments)   Severe migraine and muscle spasms      Medication List       Accurate as of 12/04/16  3:19 PM. Always use your most recent med list.          albuterol 108 (90 Base) MCG/ACT inhaler Commonly known as:  PROVENTIL HFA;VENTOLIN HFA Inhale 2 puffs into the lungs every 6 (six) hours as needed for wheezing or shortness of breath.   cetirizine 10 MG tablet Commonly known as:  ZYRTEC Take 1 tablet (10 mg total) by mouth daily.   fluticasone 50 MCG/ACT nasal spray Commonly known as:  FLONASE SHAKE LIQUID WELL AND USE 2 SPRAYS IN EACH NOSTRIL DAILY   NORLYDA 0.35 MG tablet Generic drug:  norethindrone Take 1 tablet by mouth daily.       All past medical history, surgical history, allergies, family history, immunizations andmedications were updated in the EMR today and reviewed under the history and medication portions of their EMR.     ROS: Negative, with the exception of above mentioned in HPI   Objective:  BP 101/68 (BP Location: Right Arm, Patient Position: Sitting, Cuff Size: Normal)   Pulse 78   Temp 98 F (36.7 C)   Resp 20  Wt 130 lb 12 oz (59.3 kg)   LMP 11/29/2016   SpO2 98%   BMI 23.91 kg/m  Body mass index is 23.91 kg/m. Gen: Afebrile. No acute distress. Nontoxic in appearance, well developed, well nourished.  HENT: AT. [place].  MMM, no oral lesions.  Female genitalia: not done Rectal: not indicated, guaiac negative stool obtained, hemorrhoids: external non-thrombosed and hemorrhoids: internal present.   No exam data present No results found. No results found for this or any previous visit (from the past 24 hour(s)).  Assessment/Plan: Julia Lyons is a 26 y.o. female present for OV for  Hemorrhoids, unspecified hemorrhoid type Bright red rectal bleeding - FOBT negative today. Mild Hemorrhoids present on exam.  - Miralax to keep stool soft but formed.  - Anusol suppository.  If not improved will need to see again 2 weeks. Consider GI referral.  - hydrocortisone (ANUSOL-HC) 25 MG suppository; Place 1 suppository (25 mg total) rectally 2 (two) times daily.  Dispense: 12 suppository; Refill: 0   Reviewed expectations re: course of current medical issues.  Discussed self-management of symptoms.  Outlined signs and symptoms indicating need for more acute intervention.  Patient verbalized understanding and all questions were answered.  Patient received an After-Visit Summary.    No orders of the defined types were placed in this encounter.    Note is dictated utilizing voice recognition software. Although note has been proof read prior to signing, occasional typographical errors still can be missed. If any questions arise, please do not hesitate to call for verification.   electronically signed by:  Howard Pouch, DO  [place]

## 2016-12-06 ENCOUNTER — Encounter: Payer: Self-pay | Admitting: Family Medicine

## 2017-03-28 LAB — OB RESULTS CONSOLE RPR: RPR: NONREACTIVE

## 2017-03-28 LAB — OB RESULTS CONSOLE RUBELLA ANTIBODY, IGM: RUBELLA: IMMUNE

## 2017-03-28 LAB — OB RESULTS CONSOLE ABO/RH: RH TYPE: POSITIVE

## 2017-03-28 LAB — OB RESULTS CONSOLE HIV ANTIBODY (ROUTINE TESTING): HIV: NONREACTIVE

## 2017-03-28 LAB — OB RESULTS CONSOLE GC/CHLAMYDIA
Chlamydia: NEGATIVE
Gonorrhea: NEGATIVE

## 2017-03-28 LAB — OB RESULTS CONSOLE HEPATITIS B SURFACE ANTIGEN: HEP B S AG: NEGATIVE

## 2017-03-28 LAB — OB RESULTS CONSOLE ANTIBODY SCREEN: Antibody Screen: NEGATIVE

## 2017-05-27 NOTE — L&D Delivery Note (Signed)
Delivery Note At 1:49 PM a viable female was delivered via Vaginal, Spontaneous (Presentation: ;  ).  APGAR: 9, 9; weight  .   Placenta status: , .  Cord:  with the following complications: .  Cord pH: NA  Anesthesia:   Episiotomy: None Lacerations: 1st degree;Perineal Suture Repair: 2.0 chromic Est. Blood Loss (mL):    Mom to postpartum.  Baby to Couplet care / Skin to Skin.  Mather A 11/17/2017, 2:23 PM

## 2017-06-18 ENCOUNTER — Encounter (INDEPENDENT_AMBULATORY_CARE_PROVIDER_SITE_OTHER): Payer: Self-pay

## 2017-06-18 ENCOUNTER — Other Ambulatory Visit (HOSPITAL_COMMUNITY): Payer: Self-pay | Admitting: Obstetrics and Gynecology

## 2017-06-18 ENCOUNTER — Ambulatory Visit (HOSPITAL_COMMUNITY)
Admission: RE | Admit: 2017-06-18 | Discharge: 2017-06-18 | Disposition: A | Discharge status: Home / Self Care | Payer: PRIVATE HEALTH INSURANCE | Source: Ambulatory Visit | Attending: Cardiology | Admitting: Cardiology

## 2017-06-18 ENCOUNTER — Ambulatory Visit: Payer: PRIVATE HEALTH INSURANCE | Admitting: Cardiology

## 2017-06-18 ENCOUNTER — Ambulatory Visit: Payer: Self-pay

## 2017-06-18 DIAGNOSIS — R609 Edema, unspecified: Secondary | ICD-10-CM

## 2017-06-18 DIAGNOSIS — M79604 Pain in right leg: Secondary | ICD-10-CM | POA: Insufficient documentation

## 2017-06-18 NOTE — Telephone Encounter (Signed)
   Answer Assessment - Initial Assessment Questions 1. ONSET: "When did the pain start?"      Started November 2. LOCATION: "Where is the pain located?"      Behind the right knee 3. PAIN: "How bad is the pain?"    (Scale 1-10; or mild, moderate, severe)   -  MILD (1-3): doesn't interfere with normal activities    -  MODERATE (4-7): interferes with normal activities (e.g., work or school) or awakens from sleep, limping    -  SEVERE (8-10): excruciating pain, unable to do any normal activities, unable to walk     6-7 4. WORK OR EXERCISE: "Has there been any recent work or exercise that involved this part of the body?"      No 5. CAUSE: "What do you think is causing the leg pain?"     Blood clot 6. OTHER SYMPTOMS: "Do you have any other symptoms?" (e.g., chest pain, back pain, breathing difficulty, swelling, rash, fever, numbness, weakness)     No 7. PREGNANCY: "How many weeks pregnant are you?"      19 weeks 8. EDD: "What date are you expecting to deliver?"     June 17,2019  Protocols used: PREGNANCY - LEG PAIN-A-AH  Pt. 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

## 2017-06-18 NOTE — Telephone Encounter (Signed)
Spoke with patient she states she has not contacted her OB/GYN about this she was unsure who to call. Advised patient to contact her OB/GYN to be seen since she is pregnant. Advised her top call back if she is unable to be seen by OB/GYN . She is a patient at [facility] patient verbalized understanding.

## 2017-07-03 ENCOUNTER — Encounter: Payer: Self-pay | Admitting: Family Medicine

## 2017-07-03 ENCOUNTER — Ambulatory Visit (INDEPENDENT_AMBULATORY_CARE_PROVIDER_SITE_OTHER): Payer: PRIVATE HEALTH INSURANCE | Admitting: Family Medicine

## 2017-07-03 VITALS — BP 94/61 | HR 102 | Temp 98.1°F | Resp 16 | Ht 62.0 in | Wt 139.8 lb

## 2017-07-03 DIAGNOSIS — J029 Acute pharyngitis, unspecified: Secondary | ICD-10-CM | POA: Diagnosis not present

## 2017-07-03 DIAGNOSIS — J069 Acute upper respiratory infection, unspecified: Secondary | ICD-10-CM

## 2017-07-03 LAB — POCT RAPID STREP A (OFFICE): Rapid Strep A Screen: NEGATIVE

## 2017-07-03 NOTE — Progress Notes (Signed)
OFFICE VISIT  07/03/2017   CC:  Chief Complaint  Patient presents with  . URI  . Sore Throat   HPI:    Patient is a 27 y.o. female with hx of allergic rhinitis and mild intermittent asthma who presents for respiratory symptoms. Onset 2-3 d/a, sore and scratchy throat.  Cough came on, more nasal congestion and fullness of neck glands, some L ear pain.  No fevers.  Some achiness in muscles of neck but o/w no body aches. ST persists as her worst symptoms. No wheezing.  No SOB.  No need for albuterol with this illness. No difference in her usual fatigue from pregnancy. Currently [redacted] weeks pregnant.  Halls lozenges tried, o/w no meds taken.  Past Medical History:  Diagnosis Date  . Allergy   . Asthma   . Cough 06/07/2008   Qualifier: Diagnosis of  By: Diona Browner MD, Amy    . GERD (gastroesophageal reflux disease)   . History of chlamydia 06/2011   treated with doxy  . Superficial thrombophlebitis 09/2015   right LE    Past Surgical History:  Procedure Laterality Date  . WISDOM TOOTH EXTRACTION      Outpatient Medications Prior to Visit  Medication Sig Dispense Refill  . albuterol (PROVENTIL HFA;VENTOLIN HFA) 108 (90 Base) MCG/ACT inhaler Inhale 2 puffs into the lungs every 6 (six) hours as needed for wheezing or shortness of breath. 1 Inhaler 2  . cetirizine (ZYRTEC) 10 MG tablet Take 1 tablet (10 mg total) by mouth daily. 30 tablet 11  . hydrocortisone (ANUSOL-HC) 25 MG suppository Place 1 suppository (25 mg total) rectally 2 (two) times daily. 12 suppository 0  . fluticasone (FLONASE) 50 MCG/ACT nasal spray SHAKE LIQUID WELL AND USE 2 SPRAYS IN EACH NOSTRIL DAILY (Patient not taking: Reported on 07/03/2017) 16 g 3  . NORLYDA 0.35 MG tablet Take 1 tablet by mouth daily.  12  . Prenatal Vit-Fe Fumarate-FA (PRENATAL PO) Take 1 tablet by mouth daily.     No facility-administered medications prior to visit.     Allergies  Allergen Reactions  . Depo-Provera [Medroxyprogesterone]  Other (See Comments)    Severe migraine and muscle spasms    ROS As per HPI  PE: Blood pressure 94/61, pulse (!) 102, temperature 98.1 F (36.7 C), temperature source Oral, resp. rate 16, height 5\' 2"  (1.575 m), weight 139 lb 12 oz (63.4 kg), last menstrual period 11/29/2016, SpO2 99 %, currently breastfeeding. VS: noted--normal. Gen: alert, NAD, WELL-APPEARING. HEENT: eyes without injection, drainage, or swelling.  Ears: EACs clear, TMs with normal light reflex and landmarks.  Nose: Clear rhinorrhea, with some dried, crusty exudate adherent to mildly injected mucosa.  No purulent d/c.  No paranasal sinus TTP.  No facial swelling.  Throat and mouth without focal lesion.  No pharyngial swelling but a mild amount of erythema is noted to the soft palate, and question of some petechia on uvula.  No exudate.   Neck: supple, no significant LAD.   LUNGS: CTA bilat, nonlabored resps.   CV: RRR, no m/r/g. EXT: no c/c/e SKIN: no rash  LABS:    Chemistry      Component Value Date/Time   NA 141 10/25/2016 1111   K 4.2 10/25/2016 1111   CL 107 10/25/2016 1111   CO2 30 10/25/2016 1111   BUN 9 10/25/2016 1111   CREATININE 0.78 10/25/2016 1111      Component Value Date/Time   CALCIUM 9.5 10/25/2016 1111   ALKPHOS 57 10/25/2016 1111  AST 23 10/25/2016 1111   ALT 17 10/25/2016 1111   BILITOT 0.7 10/25/2016 1111     Rapid strep today: NEG  IMPRESSION AND PLAN:  Viral URI, with cough/ST most prominent sx's.  Pt [redacted] wks pregnant. Rapid strep NEG.  Sent group A strep culture today. Salt water gargle encouraged, continue with hall's cough drops prn.  An After Visit Summary was printed and given to the patient.  FOLLOW UP: Return if symptoms worsen or fail to improve.  Signed:  Crissie Sickles, MD           07/03/2017

## 2017-07-05 LAB — CULTURE, GROUP A STREP
MICRO NUMBER:: 90169763
SPECIMEN QUALITY: ADEQUATE

## 2017-07-21 ENCOUNTER — Encounter: Payer: Self-pay | Admitting: *Deleted

## 2017-07-21 DIAGNOSIS — Z6740 Type O blood, Rh positive: Secondary | ICD-10-CM | POA: Insufficient documentation

## 2017-10-09 LAB — OB RESULTS CONSOLE GBS: GBS: NEGATIVE

## 2017-11-14 ENCOUNTER — Other Ambulatory Visit: Payer: Self-pay | Admitting: Obstetrics & Gynecology

## 2017-11-14 ENCOUNTER — Encounter (HOSPITAL_COMMUNITY): Payer: Self-pay | Admitting: *Deleted

## 2017-11-14 ENCOUNTER — Telehealth (HOSPITAL_COMMUNITY): Payer: Self-pay | Admitting: *Deleted

## 2017-11-14 NOTE — Telephone Encounter (Signed)
Preadmission screen  

## 2017-11-16 NOTE — H&P (Signed)
Julia Lyons is a 27 y.o. female presenting for induction of labor due to postdates. She reports a fast, 5 hour spontaneous labor and delivery with her daughter.   OB History    Gravida  2   Para  1   Term  1   Preterm      AB      Living  1     SAB      TAB      Ectopic      Multiple  0   Live Births  1          Past Medical History:  Diagnosis Date  . Allergy   . Asthma   . Cough 06/07/2008   Qualifier: Diagnosis of  By: Diona Browner MD, Amy    . GERD (gastroesophageal reflux disease)   . History of chlamydia 06/2011   treated with doxy  . Pregnancy 11/17/2017  . Superficial thrombophlebitis 09/2015   right LE   Past Surgical History:  Procedure Laterality Date  . MOUTH SURGERY    . WISDOM TOOTH EXTRACTION     Family History: family history is not on file. Social History:  reports that she has never smoked. She has never used smokeless tobacco. She reports that she drinks about 0.6 oz of alcohol per week. She reports that she does not use drugs.     Maternal Diabetes: No Genetic Screening: Normal Maternal Ultrasounds/Referrals: Normal Fetal Ultrasounds or other Referrals:  Other: choroid plexus cyst, now resolved Maternal Substance Abuse:  No Significant Maternal Medications:  None Significant Maternal Lab Results:  None Other Comments:  None  Review of Systems  All other systems reviewed and are negative.  Maternal Medical History:  Fetal activity: Perceived fetal activity is normal.   Last perceived fetal movement was within the past hour.    Prenatal Complications - Diabetes: none.    Dilation: 3 Effacement (%): 50 Station: -2  Midposition, medium consistency.   Blood pressure 119/60, pulse 95, temperature 98 F (36.7 C), temperature source Oral, resp. rate 18, height 5\' 1"  (1.549 m), weight 74.4 kg (164 lb), last menstrual period 02/09/2017, currently breastfeeding.   Maternal Exam:  Abdomen: Patient reports no abdominal  tenderness. Fundal height is Size = dates.   Estimated fetal weight is 8lbs .   Fetal presentation: vertex  Introitus: Normal vulva. Normal vagina.  Pelvis: adequate for delivery.      Fetal Exam Fetal Monitor Review: Mode: ultrasound.   Baseline rate: 135.  Variability: moderate (6-25 bpm).   Pattern: accelerations present and no decelerations.    Fetal State Assessment: Category I - tracings are normal.     Physical Exam  Vitals reviewed. Constitutional: She is oriented to person, place, and time. She appears well-developed and well-nourished.  HENT:  Head: Normocephalic.  Eyes: Pupils are equal, round, and reactive to light.  Cardiovascular: Normal rate, regular rhythm and normal heart sounds.  Respiratory: Effort normal and breath sounds normal.  GI: Soft. Bowel sounds are normal.  Genitourinary: Vagina normal and uterus normal.  Musculoskeletal: Normal range of motion.  Neurological: She is alert and oriented to person, place, and time.  Skin: Skin is warm and dry.  Psychiatric: She has a normal mood and affect. Her behavior is normal. Judgment and thought content normal.    Prenatal labs: ABO, Rh: O/Positive/-- (11/02 0000) Antibody: Negative (11/02 0000) Rubella: Immune (11/02 0000) RPR: Nonreactive (11/02 0000)  HBsAg: Negative (11/02 0000)  HIV: Non-reactive (11/02 0000)  GBS:  Negative (05/16 0000)   Assessment/Plan: 27 y.o. G2P1 at 41w Induction of labor for postdates Cat 1 FHTs  Bishop score 6 Admit to L&D Cytotec then pitocin for induction of labor  Anticipate NSVD  Marikay Alar 11/17/2017, 1:05 AM

## 2017-11-17 ENCOUNTER — Encounter (HOSPITAL_COMMUNITY): Payer: Self-pay

## 2017-11-17 ENCOUNTER — Inpatient Hospital Stay (HOSPITAL_COMMUNITY): Payer: PRIVATE HEALTH INSURANCE | Admitting: Anesthesiology

## 2017-11-17 ENCOUNTER — Other Ambulatory Visit: Payer: Self-pay

## 2017-11-17 ENCOUNTER — Inpatient Hospital Stay (HOSPITAL_COMMUNITY)
Admission: RE | Admit: 2017-11-17 | Discharge: 2017-11-18 | DRG: 807 | Disposition: A | Discharge status: Home / Self Care | Payer: PRIVATE HEALTH INSURANCE | Attending: Obstetrics and Gynecology | Admitting: Obstetrics and Gynecology

## 2017-11-17 DIAGNOSIS — Z86718 Personal history of other venous thrombosis and embolism: Secondary | ICD-10-CM

## 2017-11-17 DIAGNOSIS — O480 Post-term pregnancy: Principal | ICD-10-CM | POA: Diagnosis present

## 2017-11-17 DIAGNOSIS — Z3A41 41 weeks gestation of pregnancy: Secondary | ICD-10-CM | POA: Diagnosis not present

## 2017-11-17 DIAGNOSIS — O9902 Anemia complicating childbirth: Secondary | ICD-10-CM | POA: Diagnosis present

## 2017-11-17 DIAGNOSIS — I82890 Acute embolism and thrombosis of other specified veins: Secondary | ICD-10-CM | POA: Diagnosis not present

## 2017-11-17 DIAGNOSIS — D649 Anemia, unspecified: Secondary | ICD-10-CM | POA: Diagnosis present

## 2017-11-17 DIAGNOSIS — Z3490 Encounter for supervision of normal pregnancy, unspecified, unspecified trimester: Secondary | ICD-10-CM

## 2017-11-17 HISTORY — DX: Encounter for supervision of normal pregnancy, unspecified, unspecified trimester: Z34.90

## 2017-11-17 LAB — CBC
HEMATOCRIT: 35.8 % — AB (ref 36.0–46.0)
Hemoglobin: 11.7 g/dL — ABNORMAL LOW (ref 12.0–15.0)
MCH: 26.8 pg (ref 26.0–34.0)
MCHC: 32.7 g/dL (ref 30.0–36.0)
MCV: 82.1 fL (ref 78.0–100.0)
Platelets: 352 10*3/uL (ref 150–400)
RBC: 4.36 MIL/uL (ref 3.87–5.11)
RDW: 15.9 % — AB (ref 11.5–15.5)
WBC: 11.2 10*3/uL — AB (ref 4.0–10.5)

## 2017-11-17 LAB — TYPE AND SCREEN
ABO/RH(D): O POS
ANTIBODY SCREEN: NEGATIVE

## 2017-11-17 LAB — RPR: RPR Ser Ql: NONREACTIVE

## 2017-11-17 MED ORDER — ACETAMINOPHEN 325 MG PO TABS
650.0000 mg | ORAL_TABLET | ORAL | Status: DC | PRN
Start: 2017-11-17 — End: 2017-11-18

## 2017-11-17 MED ORDER — LIDOCAINE HCL (PF) 1 % IJ SOLN
30.0000 mL | INTRAMUSCULAR | Status: DC | PRN
  Filled 2017-11-17: qty 30

## 2017-11-17 MED ORDER — DIPHENHYDRAMINE HCL 50 MG/ML IJ SOLN: 12.5000 mg | INTRAMUSCULAR | Status: DC | PRN

## 2017-11-17 MED ORDER — OXYTOCIN 40 UNITS IN LACTATED RINGERS INFUSION - SIMPLE MED: 2.5000 [IU]/h | INTRAVENOUS | Status: DC

## 2017-11-17 MED ORDER — SOD CITRATE-CITRIC ACID 500-334 MG/5ML PO SOLN: 30.0000 mL | ORAL | Status: DC | PRN

## 2017-11-17 MED ORDER — ONDANSETRON HCL 4 MG/2ML IJ SOLN: 4.0000 mg | Freq: Four times a day (QID) | INTRAMUSCULAR | Status: DC | PRN

## 2017-11-17 MED ORDER — EPHEDRINE 5 MG/ML INJ
10.0000 mg | INTRAVENOUS | Status: DC | PRN
  Filled 2017-11-17: qty 2

## 2017-11-17 MED ORDER — TERBUTALINE SULFATE 1 MG/ML IJ SOLN
0.2500 mg | Freq: Once | INTRAMUSCULAR | Status: DC | PRN
  Filled 2017-11-17: qty 1

## 2017-11-17 MED ORDER — MEASLES, MUMPS & RUBELLA VAC ~~LOC~~ INJ: 0.5000 mL | INJECTION | Freq: Once | SUBCUTANEOUS | Status: DC

## 2017-11-17 MED ORDER — MISOPROSTOL 25 MCG QUARTER TABLET
25.0000 ug | ORAL_TABLET | ORAL | Status: DC | PRN
  Administered 2017-11-17 (×2): 25 ug via VAGINAL
  Filled 2017-11-17 (×3): qty 1

## 2017-11-17 MED ORDER — PHENYLEPHRINE 40 MCG/ML (10ML) SYRINGE FOR IV PUSH (FOR BLOOD PRESSURE SUPPORT)
80.0000 ug | PREFILLED_SYRINGE | INTRAVENOUS | Status: DC | PRN
  Filled 2017-11-17: qty 10
  Filled 2017-11-17: qty 5

## 2017-11-17 MED ORDER — LORATADINE 10 MG PO TABS: 10.0000 mg | ORAL_TABLET | Freq: Every day | ORAL | Status: DC

## 2017-11-17 MED ORDER — LACTATED RINGERS IV SOLN
INTRAVENOUS | Status: DC
  Administered 2017-11-17 (×3): via INTRAVENOUS

## 2017-11-17 MED ORDER — OXYTOCIN 40 UNITS IN LACTATED RINGERS INFUSION - SIMPLE MED
1.0000 m[IU]/min | INTRAVENOUS | Status: DC
  Administered 2017-11-17 (×2): 2 m[IU]/min via INTRAVENOUS
  Filled 2017-11-17: qty 1000

## 2017-11-17 MED ORDER — ACETAMINOPHEN 325 MG PO TABS: 650.0000 mg | ORAL_TABLET | ORAL | Status: DC | PRN

## 2017-11-17 MED ORDER — WITCH HAZEL-GLYCERIN EX PADS: 1.0000 "application " | MEDICATED_PAD | CUTANEOUS | Status: DC | PRN

## 2017-11-17 MED ORDER — FLEET ENEMA 7-19 GM/118ML RE ENEM: 1.0000 | ENEMA | RECTAL | Status: DC | PRN

## 2017-11-17 MED ORDER — LIDOCAINE HCL (PF) 1 % IJ SOLN
INTRAMUSCULAR | Status: DC | PRN
  Administered 2017-11-17 (×2): 4 mL via EPIDURAL

## 2017-11-17 MED ORDER — IBUPROFEN 600 MG PO TABS
600.0000 mg | ORAL_TABLET | Freq: Four times a day (QID) | ORAL | Status: DC
  Administered 2017-11-17 – 2017-11-18 (×4): 600 mg via ORAL
  Filled 2017-11-17 (×4): qty 1

## 2017-11-17 MED ORDER — DIBUCAINE 1 % RE OINT: 1.0000 "application " | TOPICAL_OINTMENT | RECTAL | Status: DC | PRN

## 2017-11-17 MED ORDER — LACTATED RINGERS IV SOLN: 500.0000 mL | Freq: Once | INTRAVENOUS | Status: DC

## 2017-11-17 MED ORDER — DIPHENHYDRAMINE HCL 25 MG PO CAPS: 25.0000 mg | ORAL_CAPSULE | Freq: Four times a day (QID) | ORAL | Status: DC | PRN

## 2017-11-17 MED ORDER — PHENYLEPHRINE 40 MCG/ML (10ML) SYRINGE FOR IV PUSH (FOR BLOOD PRESSURE SUPPORT)
80.0000 ug | PREFILLED_SYRINGE | INTRAVENOUS | Status: DC | PRN
  Filled 2017-11-17: qty 5

## 2017-11-17 MED ORDER — PRENATAL MULTIVITAMIN CH
1.0000 | ORAL_TABLET | Freq: Every day | ORAL | Status: DC
  Administered 2017-11-18: 1 via ORAL
  Filled 2017-11-17: qty 1

## 2017-11-17 MED ORDER — ONDANSETRON HCL 4 MG/2ML IJ SOLN: 4.0000 mg | INTRAMUSCULAR | Status: DC | PRN

## 2017-11-17 MED ORDER — ZOLPIDEM TARTRATE 5 MG PO TABS: 5.0000 mg | ORAL_TABLET | Freq: Every evening | ORAL | Status: DC | PRN

## 2017-11-17 MED ORDER — FLUTICASONE PROPIONATE 50 MCG/ACT NA SUSP
1.0000 | Freq: Every day | NASAL | Status: DC
  Filled 2017-11-17: qty 16

## 2017-11-17 MED ORDER — LACTATED RINGERS IV SOLN
500.0000 mL | Freq: Once | INTRAVENOUS | Status: AC
  Administered 2017-11-17: 500 mL via INTRAVENOUS

## 2017-11-17 MED ORDER — TETANUS-DIPHTH-ACELL PERTUSSIS 5-2.5-18.5 LF-MCG/0.5 IM SUSP: 0.5000 mL | Freq: Once | INTRAMUSCULAR | Status: DC

## 2017-11-17 MED ORDER — SENNOSIDES-DOCUSATE SODIUM 8.6-50 MG PO TABS
2.0000 | ORAL_TABLET | ORAL | Status: DC
  Administered 2017-11-17: 2 via ORAL
  Filled 2017-11-17: qty 2

## 2017-11-17 MED ORDER — OXYTOCIN BOLUS FROM INFUSION
500.0000 mL | Freq: Once | INTRAVENOUS | Status: AC
  Administered 2017-11-17: 500 mL via INTRAVENOUS

## 2017-11-17 MED ORDER — SIMETHICONE 80 MG PO CHEW: 80.0000 mg | CHEWABLE_TABLET | ORAL | Status: DC | PRN

## 2017-11-17 MED ORDER — ONDANSETRON HCL 4 MG PO TABS: 4.0000 mg | ORAL_TABLET | ORAL | Status: DC | PRN

## 2017-11-17 MED ORDER — FENTANYL CITRATE (PF) 100 MCG/2ML IJ SOLN: 50.0000 ug | INTRAMUSCULAR | Status: DC | PRN

## 2017-11-17 MED ORDER — COCONUT OIL OIL
1.0000 "application " | TOPICAL_OIL | Status: DC | PRN
  Administered 2017-11-18: 1 via TOPICAL
  Filled 2017-11-17: qty 120

## 2017-11-17 MED ORDER — BENZOCAINE-MENTHOL 20-0.5 % EX AERO
1.0000 "application " | INHALATION_SPRAY | CUTANEOUS | Status: DC | PRN
  Administered 2017-11-17: 1 via TOPICAL
  Filled 2017-11-17: qty 56

## 2017-11-17 MED ORDER — LACTATED RINGERS IV SOLN: 500.0000 mL | INTRAVENOUS | Status: DC | PRN

## 2017-11-17 MED ORDER — FENTANYL 2.5 MCG/ML BUPIVACAINE 1/10 % EPIDURAL INFUSION (WH - ANES)
14.0000 mL/h | INTRAMUSCULAR | Status: DC | PRN
  Administered 2017-11-17: 11 mL/h via EPIDURAL
  Filled 2017-11-17: qty 100

## 2017-11-17 NOTE — Anesthesia Procedure Notes (Signed)
Epidural Patient location during procedure: OB Start time: 11/17/2017 10:37 AM  Staffing Anesthesiologist: Josephine Igo, MD Performed: anesthesiologist   Preanesthetic Checklist Completed: patient identified, site marked, surgical consent, pre-op evaluation, timeout performed, IV checked, risks and benefits discussed and monitors and equipment checked  Epidural Patient position: sitting Prep: site prepped and draped and DuraPrep Patient monitoring: continuous pulse ox and blood pressure Approach: midline Location: L3-L4 Injection technique: LOR air  Needle:  Needle type: Tuohy  Needle gauge: 17 G Needle length: 9 cm and 9 Needle insertion depth: 4 cm Catheter type: closed end flexible Catheter size: 19 Gauge Catheter at skin depth: 9 cm Test dose: negative and Other  Assessment Events: blood not aspirated, injection not painful, no injection resistance, negative IV test and no paresthesia  Additional Notes Patient identified. Risks and benefits discussed including failed block, incomplete  Pain control, post dural puncture headache, nerve damage, paralysis, blood pressure Changes, nausea, vomiting, reactions to medications-both toxic and allergic and post Partum back pain. All questions were answered. Patient expressed understanding and wished to proceed. Sterile technique was used throughout procedure. Epidural site was Dressed with sterile barrier dressing. No paresthesias, signs of intravascular injection Or signs of intrathecal spread were encountered.  Patient was more comfortable after the epidural was dosed. Please see RN's note for documentation of vital signs and FHR which are stable.

## 2017-11-17 NOTE — Anesthesia Postprocedure Evaluation (Signed)
Anesthesia Post Note  Patient: Julia Lyons  Procedure(s) Performed: AN AD HOC LABOR EPIDURAL     Patient location during evaluation: Mother Baby Anesthesia Type: Epidural Level of consciousness: awake and alert Pain management: pain level controlled Vital Signs Assessment: post-procedure vital signs reviewed and stable Respiratory status: spontaneous breathing, nonlabored ventilation and respiratory function stable Cardiovascular status: stable Postop Assessment: no headache, no backache and epidural receding Anesthetic complications: no    Last Vitals:  Vitals:   11/17/17 1501 11/17/17 1533  BP: 98/66 (!) 108/54  Pulse: 81 76  Resp: 20 20  Temp:  36.8 C    Last Pain:  Vitals:   11/17/17 1533  TempSrc: Oral  PainSc:    Pain Goal:                 [employer]

## 2017-11-17 NOTE — Progress Notes (Signed)
Pt comfortable with epidural BP 91/62   Pulse 88   Temp 98.4 F (36.9 C) (Oral)   Resp 20   Ht 5\' 1"  (1.549 m)   Wt 74.4 kg (164 lb)   LMP 02/09/2017   BMI 30.99 kg/m   Cat 1 with some earlies toco q 2-4 minutes 4/60/-3 AROM Fetal heart tones dropped to 100s after AROM They improved with scalp stimulation and a bolus.   Monitor closely Anticipate SVD

## 2017-11-17 NOTE — Progress Notes (Signed)
Julia Lyons is a 27 y.o. G2P1001 at [redacted]w[redacted]d admitted for induction of labor due to Post dates. Due date 11/10/17.  Subjective: Able to sleep, feeling moderate discomfort with contractions.   Objective: Vitals:   11/17/17 0156 11/17/17 0338 11/17/17 0515 11/17/17 0549  BP: (!) 92/54 (!) 93/46 (!) 93/40 111/74  Pulse: 88 81 86 83  Resp: 16 16 16 16   Temp:  98.1 F (36.7 C)    TempSrc:  Oral    Weight:      Height:          FHT:  FHR: 135 bpm, variability: moderate,  accelerations:  Present,  decelerations:  Absent UC:   regular, every 1-2 minutes SVE:   Dilation: 3 Effacement (%): 70 Station: -3 Exam by:: Julia Brackett, RN   Labs: Lab Results  Component Value Date   WBC 11.2 (H) 11/17/2017   HGB 11.7 (L) 11/17/2017   HCT 35.8 (L) 11/17/2017   MCV 82.1 11/17/2017   PLT 352 11/17/2017    Assessment / Plan: Induction of labor due to postterm,  progressing well on pitocin  Labor: Progressing normally Preeclampsia:  no signs or symptoms of toxicity, intake and ouput balanced and labs stable Fetal Wellbeing:  Category I Pain Control:  Epidural and IV pain meds I/D:  n/a Anticipated MOD:  NSVD  Marikay Alar 11/17/2017, 7:02 AM

## 2017-11-17 NOTE — Lactation Note (Signed)
This note was copied from a baby's chart. Lactation Consultation Note  Patient Name: Julia Lyons [id number] Date: 11/17/2017 Reason for consult: Initial assessment;Term  P2 mother whose infant is now 20 hours old.  Mother breastfed her first child for 7 weeks.  Baby is swaddled and being held by visitor.  Mother feels confident in her breastfeeding ability at this time and has no questions/concerns.  I encouraged her to feed baby 8-12 times/24 hours or more if he shows feeding cues.  Reviewed feeding cues with family.  Discussed the importance of STS and hand expression after feeds.  Mother familiar with these concepts.    Mom made aware of O/P services, breastfeeding support groups, community resources, and our phone # for post-discharge questions. Family present and at bedside.  Mother will call for latch assistance as needed.   Maternal Data Formula Feeding for Exclusion: No Has patient been taught Hand Expression?: Yes Does the patient have breastfeeding experience prior to this delivery?: Yes  Feeding Feeding Type: Breast Fed Length of feed: 0 min                  Interventions    Lactation Tools Discussed/Used WIC Program: No   Consult Status Consult Status: Follow-up Date: 11/18/17 Follow-up type: In-patient    Little Ishikawa 11/17/2017, 8:32 PM

## 2017-11-17 NOTE — Progress Notes (Signed)
IFSE came off with vaginal exam.  Tip of IFSE intact

## 2017-11-17 NOTE — Anesthesia Preprocedure Evaluation (Signed)
Anesthesia Evaluation  Patient identified by MRN, date of birth, ID band Patient awake    Reviewed: Allergy & Precautions, Patient's Chart, lab work & pertinent test results  Airway Mallampati: II  TM Distance: >3 FB Neck ROM: Full    Dental no notable dental hx. (+) Teeth Intact   Pulmonary asthma ,    Pulmonary exam normal breath sounds clear to auscultation       Cardiovascular negative cardio ROS Normal cardiovascular exam Rhythm:Regular Rate:Normal     Neuro/Psych negative neurological ROS  negative psych ROS   GI/Hepatic Neg liver ROS, GERD  ,  Endo/Other  Obesity  Renal/GU negative Renal ROS  negative genitourinary   Musculoskeletal negative musculoskeletal ROS (+)   Abdominal (+) + obese,   Peds  Hematology  (+) anemia ,   Anesthesia Other Findings   Reproductive/Obstetrics (+) Pregnancy                             Anesthesia Physical Anesthesia Plan  ASA: II  Anesthesia Plan: Epidural   Post-op Pain Management:    Induction:   PONV Risk Score and Plan:   Airway Management Planned: Natural Airway  Additional Equipment:   Intra-op Plan:   Post-operative Plan:   Informed Consent: I have reviewed the patients History and Physical, chart, labs and discussed the procedure including the risks, benefits and alternatives for the proposed anesthesia with the patient or authorized representative who has indicated his/her understanding and acceptance.     Plan Discussed with: Anesthesiologist  Anesthesia Plan Comments:         Anesthesia Quick Evaluation

## 2017-11-17 NOTE — Progress Notes (Signed)
IFSE tip removed with tip intact

## 2017-11-17 NOTE — Plan of Care (Signed)
Patient is progressing well, bonding with baby WNL

## 2017-11-17 NOTE — Anesthesia Pain Management Evaluation Note (Signed)
  CRNA Pain Management Visit Note  Patient: Julia Lyons, 27 y.o., female  "Hello I am a member of the anesthesia team at [facility]. We have an anesthesia team available at all times to provide care throughout the hospital, including epidural management and anesthesia for C-section. I don't know your plan for the delivery whether it a natural birth, water birth, IV sedation, nitrous supplementation, doula or epidural, but we want to meet your pain goals."   1.Was your pain managed to your expectations on prior hospitalizations?   No   2.What is your expectation for pain management during this hospitalization?     Epidural  3.How can we help you reach that goal?   Record the patient's initial score and the patient's pain goal.   Pain: 0  Pain Goal: 6 [facility] wants you to be able to say your pain was always managed very well.  Jabier Mutton 11/17/2017

## 2017-11-18 DIAGNOSIS — I82890 Acute embolism and thrombosis of other specified veins: Secondary | ICD-10-CM | POA: Diagnosis not present

## 2017-11-18 LAB — CBC
HEMATOCRIT: 31.9 % — AB (ref 36.0–46.0)
HEMOGLOBIN: 10.3 g/dL — AB (ref 12.0–15.0)
MCH: 26.8 pg (ref 26.0–34.0)
MCHC: 32.3 g/dL (ref 30.0–36.0)
MCV: 83.1 fL (ref 78.0–100.0)
Platelets: 309 10*3/uL (ref 150–400)
RBC: 3.84 MIL/uL — AB (ref 3.87–5.11)
RDW: 15.9 % — ABNORMAL HIGH (ref 11.5–15.5)
WBC: 13.3 10*3/uL — ABNORMAL HIGH (ref 4.0–10.5)

## 2017-11-18 MED ORDER — IBUPROFEN 600 MG PO TABS: 600.0000 mg | ORAL_TABLET | Freq: Four times a day (QID) | ORAL | 0 refills | Status: DC

## 2017-11-18 NOTE — Discharge Instructions (Signed)
Vaginal Delivery, Care After °Refer to this sheet in the next few weeks. These instructions provide you with information about caring for yourself after vaginal delivery. Your health care provider may also give you more specific instructions. Your treatment has been planned according to current medical practices, but problems sometimes occur. Call your health care provider if you have any problems or questions. °What can I expect after the procedure? °After vaginal delivery, it is common to have: °· Some bleeding from your vagina. °· Soreness in your abdomen, your vagina, and the area of skin between your vaginal opening and your anus (perineum). °· Pelvic cramps. °· Fatigue. ° °Follow these instructions at home: °Medicines °· Take over-the-counter and prescription medicines only as told by your health care provider. °· If you were prescribed an antibiotic medicine, take it as told by your health care provider. Do not stop taking the antibiotic until it is finished. °Driving ° °· Do not drive or operate heavy machinery while taking prescription pain medicine. °· Do not drive for 24 hours if you received a sedative. °Lifestyle °· Do not drink alcohol. This is especially important if you are breastfeeding or taking medicine to relieve pain. °· Do not use tobacco products, including cigarettes, chewing tobacco, or e-cigarettes. If you need help quitting, ask your health care provider. °Eating and drinking °· Drink at least 8 eight-ounce glasses of water every day unless you are told not to by your health care provider. If you choose to breastfeed your baby, you may need to drink more water than this. °· Eat high-fiber foods every day. These foods may help prevent or relieve constipation. High-fiber foods include: °? Whole grain cereals and breads. °? Brown rice. °? Beans. °? Fresh fruits and vegetables. °Activity °· Return to your normal activities as told by your health care provider. Ask your health care provider  what activities are safe for you. °· Rest as much as possible. Try to rest or take a nap when your baby is sleeping. °· Do not lift anything that is heavier than your baby or 10 lb (4.5 kg) until your health care provider says that it is safe. °· Talk with your health care provider about when you can engage in sexual activity. This may depend on your: °? Risk of infection. °? Rate of healing. °? Comfort and desire to engage in sexual activity. °Vaginal Care °· If you have an episiotomy or a vaginal tear, check the area every day for signs of infection. Check for: °? More redness, swelling, or pain. °? More fluid or blood. °? Warmth. °? Pus or a bad smell. °· Do not use tampons or douches until your health care provider says this is safe. °· Watch for any blood clots that may pass from your vagina. These may look like clumps of dark red, brown, or black discharge. °General instructions °· Keep your perineum clean and dry as told by your health care provider. °· Wear loose, comfortable clothing. °· Wipe from front to back when you use the toilet. °· Ask your health care provider if you can shower or take a bath. If you had an episiotomy or a perineal tear during labor and delivery, your health care provider may tell you not to take baths for a certain length of time. °· Wear a bra that supports your breasts and fits you well. °· If possible, have someone help you with household activities and help care for your baby for at least a few days after   you leave the hospital.  Keep all follow-up visits for you and your baby as told by your health care provider. This is important. Contact a health care provider if:  You have: ? Vaginal discharge that has a bad smell. ? Difficulty urinating. ? Pain when urinating. ? A sudden increase or decrease in the frequency of your bowel movements. ? More redness, swelling, or pain around your episiotomy or vaginal tear. ? More fluid or blood coming from your episiotomy or  vaginal tear. ? Pus or a bad smell coming from your episiotomy or vaginal tear. ? A fever. ? A rash. ? Little or no interest in activities you used to enjoy. ? Questions about caring for yourself or your baby.  Your episiotomy or vaginal tear feels warm to the touch.  Your episiotomy or vaginal tear is separating or does not appear to be healing.  Your breasts are painful, hard, or turn red.  You feel unusually sad or worried.  You feel nauseous or you vomit.  You pass large blood clots from your vagina. If you pass a blood clot from your vagina, save it to show to your health care provider. Do not flush blood clots down the toilet without having your health care provider look at them.  You urinate more than usual.  You are dizzy or light-headed.  You have not breastfed at all and you have not had a menstrual period for 12 weeks after delivery.  You have stopped breastfeeding and you have not had a menstrual period for 12 weeks after you stopped breastfeeding. Get help right away if:  You have: ? Pain that does not go away or does not get better with medicine. ? Chest pain. ? Difficulty breathing. ? Blurred vision or spots in your vision. ? Thoughts about hurting yourself or your baby.  You develop pain in your abdomen or in one of your legs.  You develop a severe headache.  You faint.  You bleed from your vagina so much that you fill two sanitary pads in one hour. This information is not intended to replace advice given to you by your health care provider. Make sure you discuss any questions you have with your health care provider. Document Released: 05/10/2000 Document Revised: 10/25/2015 Document Reviewed: 05/28/2015 Elsevier Interactive Patient Education  2018 [employer] Instructions for Mom ACTIVITY  Gradually return to your regular activities.  Let yourself rest. Nap while your baby sleeps.  Avoid lifting anything that is heavier than 10 lb  (4.5 kg) until your health care provider says it is okay.  Avoid activities that take a lot of effort and energy (are strenuous) until approved by your health care provider. Walking at a slow-to-moderate pace is usually safe.  If you had a cesarean delivery: ? Do not vacuum, climb stairs, or drive a car for 4-6 weeks. ? Have someone help you at home until you feel like you can do your usual activities yourself. ? Do exercises as told by your health care provider, if this applies.  VAGINAL BLEEDING You may continue to bleed for 4-6 weeks after delivery. Over time, the amount of blood usually decreases and the color of the blood usually gets lighter. However, the flow of bright red blood may increase if you have been too active. If you need to use more than one pad in an hour because your pad gets soaked, or if you pass a large clot:  Lie down.  Raise your feet.  Place a cold compress on your lower abdomen.  Rest.  Call your health care provider.  If you are breastfeeding, your period should return anytime between 8 weeks after delivery and the time that you stop breastfeeding. If you are not breastfeeding, your period should return 6-8 weeks after delivery. PERINEAL CARE The perineal area, or perineum, is the part of your body between your thighs. After delivery, this area needs special care. Follow these instructions as told by your health care provider.  Take warm tub baths for 15-20 minutes.  Use medicated pads and pain-relieving sprays and creams as told.  Do not use tampons or douches until vaginal bleeding has stopped.  Each time you go to the bathroom: ? Use a peri bottle. ? Change your pad. ? Use towelettes in place of toilet paper until your stitches have healed.  Do Kegel exercises every day. Kegel exercises help to maintain the muscles that support the vagina, bladder, and bowels. You can do these exercises while you are standing, sitting, or lying down. To do Kegel  exercises: ? Tighten the muscles of your abdomen and the muscles that surround your birth canal. ? Hold for a few seconds. ? Relax. ? Repeat until you have done this 5 times in a row.  To prevent hemorrhoids from developing or getting worse: ? Drink enough fluid to keep your urine clear or pale yellow. ? Avoid straining when having a bowel movement. ? Take over-the-counter medicines and stool softeners as told by your health care provider.  BREAST CARE  Wear a tight-fitting bra.  Avoid taking over-the-counter pain medicine for breast discomfort.  Apply ice to the breasts to help with discomfort as needed: ? Put ice in a plastic bag. ? Place a towel between your skin and the bag. ? Leave the ice on for 20 minutes or as told by your health care provider.  NUTRITION  Eat a well-balanced diet.  Do not try to lose weight quickly by cutting back on calories.  Take your prenatal vitamins until your postpartum checkup or until your health care provider tells you to stop.  POSTPARTUM DEPRESSION You may find yourself crying for no apparent reason and unable to cope with all of the changes that come with having a newborn. This mood is called postpartum depression. Postpartum depression happens because your hormone levels change after delivery. If you have postpartum depression, get support from your partner, friends, and family. If the depression does not go away on its own after several weeks, contact your health care provider. BREAST SELF-EXAM Do a breast self-exam each month, at the same time of the month. If you are breastfeeding, check your breasts just after a feeding, when your breasts are less full. If you are breastfeeding and your period has started, check your breasts on day 5, 6, or 7 of your period. Report any lumps, bumps, or discharge to your health care provider. Know that breasts are normally lumpy if you are breastfeeding. This is temporary, and it is not a health  risk. INTIMACY AND SEXUALITY Avoid sexual activity for at least 3-4 weeks after delivery or until the brownish-red vaginal flow is completely gone. If you want to avoid pregnancy, use some form of birth control. You can get pregnant after delivery, even if you have not had your period. SEEK MEDICAL CARE IF:  You feel unable to cope with the changes that a child brings to your life, and these feelings do not go away after several weeks.  You notice a lump, a bump, or discharge on your breast.  SEEK IMMEDIATE MEDICAL CARE IF:  Blood soaks your pad in 1 hour or less.  You have: ? Severe pain or cramping in your lower abdomen. ? A bad-smelling vaginal discharge. ? A fever that is not controlled by medicine. ? A fever, and an area of your breast is red and sore. ? Pain or redness in your calf. ? Sudden, severe chest pain. ? Shortness of breath. ? Painful or bloody urination. ? Problems with your vision.  You vomit for 12 hours or longer.  You develop a severe headache.  You have serious thoughts about hurting yourself, your child, or anyone else.  This information is not intended to replace advice given to you by your health care provider. Make sure you discuss any questions you have with your health care provider. Document Released: 05/10/2000 Document Revised: 10/19/2015 Document Reviewed: 11/14/2014 Elsevier Interactive Patient Education  2017 [employer].

## 2017-11-18 NOTE — Discharge Summary (Signed)
OB Discharge Summary     Patient Name: Julia Lyons DOB: Oct 14, 1990 MRN: [id number]  Date of admission: 11/17/2017 Delivering MD: Crawford Givens   Date of discharge: 11/18/2017  Admitting diagnosis: INDUCTION Intrauterine pregnancy: [redacted]w[redacted]d     Secondary diagnosis:  Active Problems:   Superficial vein thrombosis  Additional problems:      Discharge diagnosis: Term Pregnancy Delivered                                                                                                Post partum procedures:  Augmentation: Pitocin and Cytotec  Complications: None  Hospital course:  Induction of Labor With Vaginal Delivery   27 y.o. yo [id number] at [redacted]w[redacted]d was admitted to the hospital 11/17/2017 for induction of labor.  Indication for induction: Postdates.  Patient had an uncomplicated labor course as follows: Membrane Rupture Time/Date: 11:04 AM ,11/17/2017   Intrapartum Procedures: Episiotomy: None [1]                                         Lacerations:  1st degree [2];Perineal [11]  Patient had delivery of a Viable infant.  Information for the patient's newborn:  Lennyn, Gange [[id number]]  Delivery Method: Vaginal, Spontaneous(Filed from Delivery Summary)   11/17/2017  Details of delivery can be found in separate delivery note.  Patient had a routine postpartum course. Patient is discharged home 11/18/17.  Physical exam  Vitals:   11/17/17 1700 11/17/17 2048 11/17/17 2346 11/18/17 0549  BP: (!) 97/54 106/70 109/78 102/67  Pulse: 76 74 77 72  Resp: 18 16 16 16   Temp: 97.9 F (36.6 C) 98 F (36.7 C) (!) 97.5 F (36.4 C) 97.8 F (36.6 C)  TempSrc: Oral Oral Oral Oral  SpO2:    100%  Weight:      Height:       General: alert, cooperative and no distress Lochia: appropriate Uterine Fundus: firm Incision:  DVT Evaluation: No evidence of DVT seen on physical exam. Labs: Lab Results  Component Value Date   WBC 13.3 (H) 11/18/2017   HGB 10.3 (L) 11/18/2017   HCT 31.9 (L) 11/18/2017   MCV 83.1 11/18/2017   PLT 309 11/18/2017   CMP Latest Ref Rng & Units 10/25/2016  Glucose 70 - 99 mg/dL 80  BUN 6 - 23 mg/dL 9  Creatinine 0.40 - 1.20 mg/dL 0.78  Sodium 135 - 145 mEq/L 141  Potassium 3.5 - 5.1 mEq/L 4.2  Chloride 96 - 112 mEq/L 107  CO2 19 - 32 mEq/L 30  Calcium 8.4 - 10.5 mg/dL 9.5  Total Protein 6.0 - 8.3 g/dL 7.0  Total Bilirubin 0.2 - 1.2 mg/dL 0.7  Alkaline Phos 39 - 117 U/L 57  AST 0 - 37 U/L 23  ALT 0 - 35 U/L 17    Discharge instruction: per After Visit Summary and "Baby and Me Booklet".  After visit meds:  Allergies as of 11/18/2017      Reactions   Depo-provera [medroxyprogesterone] Other (  See Comments)   Severe migraine and muscle spasms      Medication List    TAKE these medications   aspirin EC 81 MG tablet Take 81 mg by mouth daily.   cetirizine 10 MG tablet Commonly known as:  ZYRTEC Take 1 tablet (10 mg total) by mouth daily.   fluticasone 50 MCG/ACT nasal spray Commonly known as:  FLONASE SHAKE LIQUID WELL AND USE 2 SPRAYS IN EACH NOSTRIL DAILY   ibuprofen 600 MG tablet Commonly known as:  ADVIL,MOTRIN Take 1 tablet (600 mg total) by mouth every 6 (six) hours.   PRENATAL PO Take 1 tablet by mouth daily.       Diet: routine diet  Activity: Advance as tolerated. Pelvic rest for 6 weeks.   Outpatient follow up:6 weeks Follow up Appt:No future appointments. Follow up Visit:No follow-ups on file.  Postpartum contraception: Undecided  Newborn Data: Live born female  Birth Weight: 8 lb 14 oz (4025 g) APGAR: 28, 9  Newborn Delivery   Birth date/time:  11/17/2017 13:49:00 Delivery type:  Vaginal, Spontaneous     Baby Feeding: Breast Disposition:home with mother Circ in office  11/18/2017 Larey Days, CNM

## 2017-11-18 NOTE — Lactation Note (Signed)
This note was copied from a baby's chart. Lactation Consultation Note  Patient Name: Julia Lyons VPCHE'K Date: 11/18/2017  [name] 35 hours old.  Mom wants early discharge. Mom reports nipples are sore. Blood blister on left nipple intact.right nipple blister on tip also.  Mom reports using cradle hold.  Discussed  latch on and positioning.Showed mom [employer].  Urged mom to start in cross cradle hold to give more breast and baby stabilty and once he was latched well slowly release breast and go to cradle hold.Reviewed sore nipple and engorgement prevention and treatment.  Gave breast shells and comfort gels with instruction.  Mom reports she has a manual and double electric breast pump at home.Gave information on [facility]/Hel[facility]/ and outpatient services.    Maternal Data    Feeding Feeding Type: Breast Fed Length of feed: 40 min  LATCH Score                   Interventions    Lactation Tools Discussed/Used     Consult Status      Julia Lyons 11/18/2017, 12:39 PM

## 2017-12-24 DIAGNOSIS — N61 Mastitis without abscess: Secondary | ICD-10-CM | POA: Diagnosis not present

## 2017-12-30 DIAGNOSIS — O9123 Nonpurulent mastitis associated with lactation: Secondary | ICD-10-CM | POA: Diagnosis not present

## 2017-12-30 DIAGNOSIS — Z113 Encounter for screening for infections with a predominantly sexual mode of transmission: Secondary | ICD-10-CM | POA: Diagnosis not present

## 2018-01-01 DIAGNOSIS — N611 Abscess of the breast and nipple: Secondary | ICD-10-CM | POA: Diagnosis not present

## 2018-01-02 DIAGNOSIS — N6002 Solitary cyst of left breast: Secondary | ICD-10-CM | POA: Diagnosis not present

## 2018-02-05 ENCOUNTER — Telehealth: Payer: Self-pay | Admitting: Family Medicine

## 2018-02-05 NOTE — Telephone Encounter (Signed)
Flonase 50 mcg/act nasal spray refill Last Refill:09/27/16 # 16 g Last OV: 07/03/17 acute visit with Dr. Anitra Lauth.  LOV with [facility] 12/04/16 PCP: Raoul Pitch Pharmacy:[id number]  Zyrtec 10 mg refill Last Refill:09/27/16 # 30 Last OV: See above PCP: [facility] Pharmacy:See above

## 2018-02-05 NOTE — Telephone Encounter (Signed)
Spoke with patient let her know she would need to be seen. Offered patient an appt she declined at this time . She states she will call back to schedule an appointment.

## 2018-02-05 NOTE — Telephone Encounter (Signed)
Copied from [place] [id number]. Topic: Quick Communication - Rx Refill/Question >> Feb 05, 2018  8:27 AM Gardiner Ramus wrote: Medication:fluticasone [facility]) 50 MCG/ACT nasal spray [[id number]] cetirizine (ZYRTEC) 10 MG tablet [[id number]]   Has the patient contacted their pharmacy?no Preferred Pharmacy (with phone number or street name): [id number] [place], [place] [phone] (Phone) [phone] (Fax)   Agent: Please be advised that RX refills may take up to 3 business days. We ask that you follow-up with your pharmacy.

## 2018-03-11 ENCOUNTER — Encounter: Payer: Self-pay | Admitting: Family Medicine

## 2018-03-11 ENCOUNTER — Ambulatory Visit (INDEPENDENT_AMBULATORY_CARE_PROVIDER_SITE_OTHER): Payer: PRIVATE HEALTH INSURANCE | Admitting: Family Medicine

## 2018-03-11 VITALS — BP 108/70 | HR 81 | Temp 98.2°F | Resp 18 | Ht 61.0 in | Wt 145.0 lb

## 2018-03-11 DIAGNOSIS — J339 Nasal polyp, unspecified: Secondary | ICD-10-CM

## 2018-03-11 DIAGNOSIS — Z0000 Encounter for general adult medical examination without abnormal findings: Secondary | ICD-10-CM

## 2018-03-11 DIAGNOSIS — Z79899 Other long term (current) drug therapy: Secondary | ICD-10-CM

## 2018-03-11 DIAGNOSIS — Z130 Encounter for screening for diseases of the blood and blood-forming organs and certain disorders involving the immune mechanism: Secondary | ICD-10-CM

## 2018-03-11 DIAGNOSIS — J301 Allergic rhinitis due to pollen: Secondary | ICD-10-CM

## 2018-03-11 DIAGNOSIS — Z789 Other specified health status: Secondary | ICD-10-CM | POA: Diagnosis not present

## 2018-03-11 DIAGNOSIS — Z131 Encounter for screening for diabetes mellitus: Secondary | ICD-10-CM | POA: Diagnosis not present

## 2018-03-11 DIAGNOSIS — E663 Overweight: Secondary | ICD-10-CM | POA: Diagnosis not present

## 2018-03-11 DIAGNOSIS — J4521 Mild intermittent asthma with (acute) exacerbation: Secondary | ICD-10-CM

## 2018-03-11 LAB — COMPREHENSIVE METABOLIC PANEL
ALT: 26 U/L (ref 0–35)
AST: 21 U/L (ref 0–37)
Albumin: 4.7 g/dL (ref 3.5–5.2)
Alkaline Phosphatase: 138 U/L — ABNORMAL HIGH (ref 39–117)
BILIRUBIN TOTAL: 0.7 mg/dL (ref 0.2–1.2)
BUN: 16 mg/dL (ref 6–23)
CALCIUM: 9.8 mg/dL (ref 8.4–10.5)
CO2: 29 meq/L (ref 19–32)
CREATININE: 0.82 mg/dL (ref 0.40–1.20)
Chloride: 103 mEq/L (ref 96–112)
GFR: 89.72 mL/min (ref >60.00)
Glucose, Bld: 76 mg/dL (ref 70–99)
Potassium: 4 mEq/L (ref 3.5–5.1)
Sodium: 138 mEq/L (ref 135–145)
TOTAL PROTEIN: 7.8 g/dL (ref 6.0–8.3)

## 2018-03-11 LAB — CBC WITH DIFFERENTIAL/PLATELET
BASOS ABS: 0 10*3/uL (ref 0.0–0.1)
Basophils Relative: 0.5 % (ref 0.0–3.0)
Eosinophils Absolute: 0.4 10*3/uL (ref 0.0–0.7)
Eosinophils Relative: 5.2 % — ABNORMAL HIGH (ref 0.0–5.0)
HEMATOCRIT: 42.4 % (ref 36.0–46.0)
HEMOGLOBIN: 13.8 g/dL (ref 12.0–15.0)
LYMPHS PCT: 36 % (ref 12.0–46.0)
Lymphs Abs: 2.8 10*3/uL (ref 0.7–4.0)
MCHC: 32.6 g/dL (ref 30.0–36.0)
MCV: 84.6 fl (ref 78.0–100.0)
Monocytes Absolute: 0.7 10*3/uL (ref 0.1–1.0)
Monocytes Relative: 8.9 % (ref 3.0–12.0)
NEUTROS ABS: 3.9 10*3/uL (ref 1.4–7.7)
Neutrophils Relative %: 49.4 % (ref 43.0–77.0)
PLATELETS: 283 10*3/uL (ref 150.0–400.0)
RBC: 5.01 Mil/uL (ref 3.87–5.11)
RDW: 15.3 % (ref 11.5–15.5)
WBC: 7.8 10*3/uL (ref 4.0–10.5)

## 2018-03-11 LAB — LIPID PANEL
CHOL/HDL RATIO: 4
CHOLESTEROL: 237 mg/dL — AB (ref 0–200)
HDL: 59.4 mg/dL (ref >39.00)
LDL CALC: 148 mg/dL — AB (ref 0–99)
NonHDL: 177.1
Triglycerides: 146 mg/dL (ref 0.0–149.0)
VLDL: 29.2 mg/dL (ref 0.0–40.0)

## 2018-03-11 LAB — HEMOGLOBIN A1C: Hgb A1c MFr Bld: 5.3 % (ref 4.6–6.5)

## 2018-03-11 MED ORDER — FLUTICASONE PROPIONATE 50 MCG/ACT NA SUSP: NASAL | 3 refills | Status: DC

## 2018-03-11 MED ORDER — CETIRIZINE HCL 10 MG PO TABS: 10.0000 mg | ORAL_TABLET | Freq: Every day | ORAL | 3 refills | Status: AC

## 2018-03-11 NOTE — Patient Instructions (Signed)

## 2018-03-11 NOTE — Progress Notes (Signed)
Patient ID: Julia Lyons, female  DOB: [birth year], 27 y.o.   MRN: [id number] Patient Care Team    Relationship Specialty Notifications Start End  Ma Hillock, DO PCP - General Family Medicine  09/27/16   Earnstine Regal, PA-C Physician Assistant Obstetrics and Gynecology  09/27/16   Melida Quitter, MD Consulting Physician Otolaryngology  10/25/16     Chief Complaint  Patient presents with  . Annual Exam    Subjective:  Julia Lyons is a 27 y.o.  Female  present for CPE. All past medical history, surgical history, allergies, family history, immunizations, medications and social history were updated in the electronic medical record today. All recent labs, ED visits and hospitalizations within the last year were reviewed.  Health maintenance: updated 03/11/18 Colonoscopy: No Fhx. Screen at 52.  Mammogram: No fhx, screen at 42 Cervical cancer screening: Follows with GYN routinely. E.Powell, NP [facility]. Immunizations: tdap UTD 2015, Influenza declined (encouraged yearly) Infectious disease screening: HIV completed Assistive device: none Oxygen GYK:ZLDJ Patient has a Dental home. Hospitalizations/ED visits: reviewed  Depression screen [facility] 2/9 03/11/2018 10/25/2016 09/27/2016 10/16/2015  Decreased Interest 0 0 0 0  Down, Depressed, Hopeless 0 0 0 0  PHQ - 2 Score 0 0 0 0   No flowsheet data found.   Current Exercise Habits: The patient does not participate in regular exercise at present Exercise limited by: None identified   There is no immunization history for the selected administration types on file for this patient.   Past Medical History:  Diagnosis Date  . Allergy   . Asthma   . Chronic constipation   . Cough 06/07/2008   Qualifier: Diagnosis of  By: Diona Browner MD, Amy    . GERD (gastroesophageal reflux disease)   . Hemorrhoid   . History of chlamydia 06/2011   treated with doxy  . Pregnancy 11/17/2017  . Superficial thrombophlebitis 09/2015   right LE   Allergies  Allergen Reactions  . Depo-Provera [Medroxyprogesterone] Other (See Comments)    Severe migraine and muscle spasms   Past Surgical History:  Procedure Laterality Date  . MOUTH SURGERY    . WISDOM TOOTH EXTRACTION     Family History  Problem Relation Age of Onset  . Breast cancer Neg Hx   . Colon cancer Neg Hx   . Heart disease Neg Hx    Social History   Socioeconomic History  . Marital status: Single    Spouse name: Not on file  . Number of children: 1  . Years of education: 57  . Highest education level: Not on file  Occupational History  . Occupation: Risk analyst  . Financial resource strain: Not on file  . Food insecurity:    Worry: Not on file    Inability: Not on file  . Transportation needs:    Medical: Not on file    Non-medical: Not on file  Tobacco Use  . Smoking status: Never Smoker  . Smokeless tobacco: Never Used  Substance and Sexual Activity  . Alcohol use: Yes    Alcohol/week: 1.0 standard drinks    Types: 1 Glasses of wine per week    Comment: every few weeks; but not while pregnant  . Drug use: No  . Sexual activity: Yes    Partners: Male    Birth control/protection: IUD  Lifestyle  . Physical activity:    Days per week: Not on file    Minutes per session: Not on file  .  Stress: Not on file  Relationships  . Social connections:    Talks on phone: Not on file    Gets together: Not on file    Attends religious service: Not on file    Active member of club or organization: Not on file    Attends meetings of clubs or organizations: Not on file    Relationship status: Not on file  . Intimate partner violence:    Fear of current or ex partner: Not on file    Emotionally abused: Not on file    Physically abused: Not on file    Forced sexual activity: Not on file  Other Topics Concern  . Not on file  Social History Narrative   Single. 1 daughter Hadynn.    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    Drinks  caffeine. Takes a daily vitamin.    Wears seatbelt, bicycle helmet. Smoke detector in the home.    Exercises routinely.    Feels safe in her relationships.       Allergies as of 03/11/2018      Reactions   Depo-provera [medroxyprogesterone] Other (See Comments)   Severe migraine and muscle spasms      Medication List        Accurate as of 03/11/18  1:14 PM. Always use your most recent med list.          aspirin EC 81 MG tablet Take 81 mg by mouth daily.   cetirizine 10 MG tablet Commonly known as:  ZYRTEC Take 1 tablet (10 mg total) by mouth daily.   fluticasone 50 MCG/ACT nasal spray Commonly known as:  FLONASE SHAKE LIQUID WELL AND USE 2 SPRAYS IN EACH NOSTRIL DAILY   multivitamin capsule Take 1 capsule by mouth daily.       All past medical history, surgical history, allergies, family history, immunizations andmedications were updated in the EMR today and reviewed under the history and medication portions of their EMR.     No results found for this or any previous visit (from the past 2160 hour(s)).  No results found.   ROS: 14 pt review of systems performed and negative (unless mentioned in an HPI)  Objective: BP 108/70 (BP Location: Right Arm, Patient Position: Sitting, Cuff Size: Normal)   Pulse 81   Temp 98.2 F (36.8 C)   Resp 18   Ht '5\' 1"'  (1.549 m)   Wt 145 lb (65.8 kg)   SpO2 98%   Breastfeeding? Yes   BMI 27.40 kg/m  Gen: Afebrile. No acute distress. Nontoxic in appearance, well-developed, well-nourished,  Pleasant caucasian female, mildly overweight HENT: AT. [place]. Bilateral TM visualized and normal in appearance, normal external auditory canal. MMM, no oral lesions, adequate dentition. Bilateral nares within normal limits. Throat without erythema, ulcerations or exudates. no Cough on exam, no hoarseness on exam. Eyes:Pupils Equal Round Reactive to light, Extraocular movements intact,  Conjunctiva without redness, discharge or  icterus. Neck/lymp/endocrine: Supple,no lymphadenopathy, no thyromegaly CV: RRR no murmur, no edema, +2/4 P posterior tibialis pulses. no carotid bruits. No JVD. Chest: CTAB, no wheeze, rhonchi or crackles. normal Respiratory effort. good Air movement. Abd: Soft. flat. NTND. BS present. no Masses palpated. No hepatosplenomegaly. No rebound tenderness or guarding. Skin: no rashes, purpura or petechiae. Warm and well-perfused. Skin intact. Neuro/Msk:  Normal gait. PERLA. EOMi. Alert. Oriented x3.  Cranial nerves II through XII intact. Muscle strength 5/5 upper/lower extremity. DTRs equal bilaterally. Psych: Normal affect, dress and demeanor. Normal speech. Normal thought  content and judgment.   No exam data present  Assessment/plan: Channel Papandrea is a 27 y.o. female present for CPE. Encounter for long-term (current) use of medications - Comp Met (CMET) Screening for diabetes mellitus - HgB A1c Screening for iron deficiency anemia - CBC w/Diff Seasonal allergic rhinitis due to pollen/Mild intermittent asthma with acute exacerbation - stable. Refills on zyrtec and flonase.  - cetirizine (ZYRTEC) 10 MG tablet; Take 1 tablet (10 mg total) by mouth daily.   Dispense: 90 tablet; Refill: 3 - cetirizine (ZYRTEC) 10 MG tablet; Take 1 tablet (10 mg total) by mouth daily.  Dispense: 90 tablet; Refill: 3 Overweight (BMI 25.0-29.9)/Uses birth control - Lipid panel Encounter for general adult medical examination with abnormal findings Patient was encouraged to exercise greater than 150 minutes a week. Patient was encouraged to choose a diet filled with fresh fruits and vegetables, and lean meats. AVS provided to patient today for education/recommendation on gender specific health and safety maintenance. Colonoscopy: No Fhx. Screen at 52.  Mammogram: No fhx, screen at 42 Cervical cancer screening: Follows with GYN routinely. E.Powell, NP [facility]. Immunizations: tdap UTD 2015,  Influenza declined (encouraged yearly) Infectious disease screening: HIV completed  Return in about 1 year (around 03/12/2019) for CPE.  Electronically signed by: Howard Pouch, DO [place]

## 2018-08-20 ENCOUNTER — Other Ambulatory Visit: Payer: Self-pay

## 2018-08-20 NOTE — Telephone Encounter (Signed)
RF request for Albuterol Inhaler  LOV: 03/11/18  Next ov: Not scheduled  Last written: 09/27/2016   Med is listed under hx but not active med

## 2018-08-21 MED ORDER — ALBUTEROL SULFATE HFA 108 (90 BASE) MCG/ACT IN AERS: 2.0000 | INHALATION_SPRAY | Freq: Four times a day (QID) | RESPIRATORY_TRACT | 0 refills | Status: DC | PRN

## 2018-08-21 NOTE — Telephone Encounter (Signed)
Received paper refill request from [facility]. Sent to pharmacy electronically.

## 2018-08-21 NOTE — Telephone Encounter (Signed)
Pt returned call. I returned call and it went to VM that was full.

## 2018-08-21 NOTE — Addendum Note (Signed)
Addended by: Caroll Rancher L on: 08/21/2018 03:52 PM   Modules accepted: Orders

## 2018-08-21 NOTE — Addendum Note (Signed)
Addended by: Howard Pouch A on: 08/21/2018 09:57 AM   Modules accepted: Orders

## 2018-08-21 NOTE — Telephone Encounter (Signed)
I am ok to send a refill on albuterol inhaler and have done so x1, but these are being prescribed sparingly since covid19 since there can be a shortage.  Patient should be called and asked if she is having symptoms and offered a video visit if so.   There are 3 pharmacies listed for her. I pended the order until we verify location- please remove all other pharmacies.

## 2018-08-21 NOTE — Telephone Encounter (Signed)
Called pts cell phone. No answer and VM was full.

## 2018-08-26 ENCOUNTER — Ambulatory Visit (INDEPENDENT_AMBULATORY_CARE_PROVIDER_SITE_OTHER): Payer: Medicaid Other | Admitting: Family Medicine

## 2018-08-26 ENCOUNTER — Other Ambulatory Visit: Payer: Self-pay

## 2018-08-26 ENCOUNTER — Encounter: Payer: Self-pay | Admitting: Family Medicine

## 2018-08-26 DIAGNOSIS — K130 Diseases of lips: Secondary | ICD-10-CM | POA: Diagnosis not present

## 2018-08-26 MED ORDER — CLOTRIMAZOLE 1 % EX CREA: 1.0000 "application " | TOPICAL_CREAM | Freq: Two times a day (BID) | CUTANEOUS | 0 refills | Status: DC

## 2018-08-26 NOTE — Progress Notes (Signed)
   Virtual Visit via Video   I connected with Julia Lyons  on 08/26/18 at 11:00 AM EDT by a video enabled telemedicine application and verified that I am speaking with the correct person using two identifiers. Location patient: Home Location provider: [facility], Office Persons participating in the virtual visit: Patient, Dr. Raoul Pitch, Arliss Journey, LPN  I discussed the limitations of evaluation and management by telemedicine and the availability of in person appointments. The patient expressed understanding and agreed to proceed.  Subjective:   HPI: Reports she has had redness, discomfort and cracks in the bilateral corners of her mouth for approximately 1 to 2 weeks.  She denies any fevers or chills.  She denies any drainage from this location.  She denies any swelling.  He has tried to keep coated with petroleum jelly which seems to help a little bit, but is not resolving and as soon as she stops using the petroleum it worsens.  Never had anything like this before.  ROS: See pertinent positives and negatives per HPI.  Patient Active Problem List   Diagnosis Date Noted  . Blood type O+ 07/21/2017  . Nasal polyp 09/27/2016  . Seasonal allergic rhinitis due to pollen 09/27/2016  . Exercise-induced asthma 10/22/2011    Social History   Tobacco Use  . Smoking status: Never Smoker  . Smokeless tobacco: Never Used  Substance Use Topics  . Alcohol use: Yes    Alcohol/week: 1.0 standard drinks    Types: 1 Glasses of wine per week    Comment: every few weeks; but not while pregnant    Current Outpatient Medications:  .  albuterol (PROVENTIL HFA;VENTOLIN HFA) 108 (90 Base) MCG/ACT inhaler, Inhale 2 puffs into the lungs every 6 (six) hours as needed for wheezing or shortness of breath., Disp: 1 Inhaler, Rfl: 0 .  cetirizine (ZYRTEC) 10 MG tablet, Take 1 tablet (10 mg total) by mouth daily., Disp: 90 tablet, Rfl: 3 .  fluticasone (FLONASE) 50 MCG/ACT nasal spray, SHAKE LIQUID  WELL AND USE 2 SPRAYS IN EACH NOSTRIL DAILY, Disp: 16 g, Rfl: 3 .  Multiple Vitamin (MULTIVITAMIN) capsule, Take 1 capsule by mouth daily., Disp: , Rfl:  .  clotrimazole (LOTRIMIN) 1 % cream, Apply 1 application topically 2 (two) times daily. 2-3 weeks use., Disp: 30 g, Rfl: 0  Allergies  Allergen Reactions  . Depo-Provera [Medroxyprogesterone] Other (See Comments)    Severe migraine and muscle spasms    Objective:   Gen: No acute distress. Nontoxic in appearance.  HENT: AT. [place].  MMM.  Eyes:  Conjunctiva without redness, discharge or icterus. Skin: Bilateral dryness and cracks of oral commissures, mild erythema, no drainage., no purpura or petechiae.  Neuro:Alert. Oriented x3    Assessment and Plan:  Angular cheilitis -New problem.  Patient was provided instructions on angular cheilitis and common causes.  Antifungal cream was prescribed to apply in corners of the mouth 2 times a day for 2-3 weeks.  Patient was encouraged to avoid excessive licking of the lips.  Apply barrier cream such as petroleum or a zinc barrier creams in between applications of antifungal cream.  If worsening she is instructed to call back in and we would need to consider bacterial causes instead of yeast.  She agrees to plan and reports understanding.  > 15 minutes spent with patient, >50% of time spent face to face counseling    Howard Pouch, DO 08/26/2018

## 2018-08-26 NOTE — Patient Instructions (Signed)
Angular Cheilitis is usually caused by yeast.  Prescribed clotrimazole cream to use for 2-3 weeks.   You can use barrier cream in between applications such as petroleum or zinc barrier creams.   Try to avoid licking lips.   If this is worsening, despite use of the above cream and recommendations, please call back in to discuss.  Less likely cases sometimes it is caused by A coccus bacteria and we would need to attempt a different approach if that is the case.  Again, yeast is the more common cause.

## 2018-09-16 ENCOUNTER — Telehealth: Payer: Self-pay

## 2018-09-16 NOTE — Telephone Encounter (Signed)
Copied from [place] [id number]. Topic: General - Other >> Sep 16, 2018  9:57 AM Alanda Slim E wrote: Reason for CRM: Pt needs documentation showing that she has asthma. She needs it as soon as possible/ can email if possible to [email or web address] please advise  Tried to call patient to see what note will be for with no answer. Unable to LM due to VM being full

## 2018-09-17 NOTE — Telephone Encounter (Signed)
Pt states this request is for a grant, it asked if she has asthma and then documentation that she does. [phone]

## 2018-09-18 NOTE — Telephone Encounter (Signed)
Ok to [employer] whom it may concern- stating the patient has asthma.

## 2018-09-18 NOTE — Telephone Encounter (Signed)
Pt is asking for letter stating she has Asthma in order to receive a grant. Please advise

## 2018-09-18 NOTE — Telephone Encounter (Signed)
Letter drafted and patient was called to come and pick up or to call back and let us know if she would like it mailed out to her.

## 2019-02-09 ENCOUNTER — Ambulatory Visit: Payer: Medicaid Other | Admitting: Family Medicine

## 2019-02-16 DIAGNOSIS — F329 Major depressive disorder, single episode, unspecified: Secondary | ICD-10-CM | POA: Diagnosis not present

## 2019-02-16 DIAGNOSIS — Z1331 Encounter for screening for depression: Secondary | ICD-10-CM | POA: Diagnosis not present

## 2019-02-16 DIAGNOSIS — R5383 Other fatigue: Secondary | ICD-10-CM | POA: Diagnosis not present

## 2019-02-16 DIAGNOSIS — F419 Anxiety disorder, unspecified: Secondary | ICD-10-CM | POA: Diagnosis not present

## 2019-02-16 DIAGNOSIS — E7800 Pure hypercholesterolemia, unspecified: Secondary | ICD-10-CM | POA: Diagnosis not present

## 2019-02-16 DIAGNOSIS — E559 Vitamin D deficiency, unspecified: Secondary | ICD-10-CM | POA: Diagnosis not present

## 2019-03-02 DIAGNOSIS — F329 Major depressive disorder, single episode, unspecified: Secondary | ICD-10-CM | POA: Diagnosis not present

## 2019-03-02 DIAGNOSIS — E785 Hyperlipidemia, unspecified: Secondary | ICD-10-CM | POA: Diagnosis not present

## 2019-03-02 DIAGNOSIS — E559 Vitamin D deficiency, unspecified: Secondary | ICD-10-CM | POA: Diagnosis not present

## 2019-03-02 DIAGNOSIS — F419 Anxiety disorder, unspecified: Secondary | ICD-10-CM | POA: Diagnosis not present

## 2019-03-11 DIAGNOSIS — F321 Major depressive disorder, single episode, moderate: Secondary | ICD-10-CM | POA: Diagnosis not present

## 2019-03-18 ENCOUNTER — Encounter: Payer: PRIVATE HEALTH INSURANCE | Admitting: Family Medicine

## 2019-04-08 DIAGNOSIS — F321 Major depressive disorder, single episode, moderate: Secondary | ICD-10-CM | POA: Diagnosis not present

## 2019-04-12 DIAGNOSIS — F321 Major depressive disorder, single episode, moderate: Secondary | ICD-10-CM | POA: Diagnosis not present

## 2019-04-13 DIAGNOSIS — F419 Anxiety disorder, unspecified: Secondary | ICD-10-CM | POA: Diagnosis not present

## 2019-04-13 DIAGNOSIS — Z832 Family history of diseases of the blood and blood-forming organs and certain disorders involving the immune mechanism: Secondary | ICD-10-CM | POA: Diagnosis not present

## 2019-04-13 DIAGNOSIS — F329 Major depressive disorder, single episode, unspecified: Secondary | ICD-10-CM | POA: Diagnosis not present

## 2019-04-13 DIAGNOSIS — E785 Hyperlipidemia, unspecified: Secondary | ICD-10-CM | POA: Diagnosis not present

## 2019-04-19 DIAGNOSIS — N946 Dysmenorrhea, unspecified: Secondary | ICD-10-CM | POA: Diagnosis not present

## 2019-04-19 DIAGNOSIS — M545 Low back pain: Secondary | ICD-10-CM | POA: Diagnosis not present

## 2019-04-21 ENCOUNTER — Encounter: Payer: Self-pay | Admitting: Family Medicine

## 2019-05-10 DIAGNOSIS — Z832 Family history of diseases of the blood and blood-forming organs and certain disorders involving the immune mechanism: Secondary | ICD-10-CM | POA: Diagnosis not present

## 2019-05-10 DIAGNOSIS — E785 Hyperlipidemia, unspecified: Secondary | ICD-10-CM | POA: Diagnosis not present

## 2019-05-10 DIAGNOSIS — E559 Vitamin D deficiency, unspecified: Secondary | ICD-10-CM | POA: Diagnosis not present

## 2019-05-10 DIAGNOSIS — F419 Anxiety disorder, unspecified: Secondary | ICD-10-CM | POA: Diagnosis not present

## 2019-05-10 DIAGNOSIS — Z8672 Personal history of thrombophlebitis: Secondary | ICD-10-CM | POA: Diagnosis not present

## 2019-05-10 DIAGNOSIS — E7800 Pure hypercholesterolemia, unspecified: Secondary | ICD-10-CM | POA: Diagnosis not present

## 2019-06-09 DIAGNOSIS — F321 Major depressive disorder, single episode, moderate: Secondary | ICD-10-CM | POA: Diagnosis not present

## 2019-06-25 DIAGNOSIS — R1010 Upper abdominal pain, unspecified: Secondary | ICD-10-CM | POA: Diagnosis not present

## 2019-06-25 DIAGNOSIS — Z3202 Encounter for pregnancy test, result negative: Secondary | ICD-10-CM | POA: Diagnosis not present

## 2019-06-30 ENCOUNTER — Encounter: Payer: Self-pay | Admitting: Family Medicine

## 2019-06-30 DIAGNOSIS — Z124 Encounter for screening for malignant neoplasm of cervix: Secondary | ICD-10-CM | POA: Diagnosis not present

## 2019-06-30 DIAGNOSIS — Z01419 Encounter for gynecological examination (general) (routine) without abnormal findings: Secondary | ICD-10-CM | POA: Diagnosis not present

## 2019-06-30 DIAGNOSIS — Z0000 Encounter for general adult medical examination without abnormal findings: Secondary | ICD-10-CM | POA: Diagnosis not present

## 2019-06-30 LAB — HM PAP SMEAR

## 2019-08-24 DIAGNOSIS — E559 Vitamin D deficiency, unspecified: Secondary | ICD-10-CM | POA: Diagnosis not present

## 2019-08-24 DIAGNOSIS — F419 Anxiety disorder, unspecified: Secondary | ICD-10-CM | POA: Diagnosis not present

## 2019-08-24 DIAGNOSIS — E785 Hyperlipidemia, unspecified: Secondary | ICD-10-CM | POA: Diagnosis not present

## 2019-08-24 DIAGNOSIS — F329 Major depressive disorder, single episode, unspecified: Secondary | ICD-10-CM | POA: Diagnosis not present

## 2019-08-24 DIAGNOSIS — E7800 Pure hypercholesterolemia, unspecified: Secondary | ICD-10-CM | POA: Diagnosis not present

## 2019-08-27 DIAGNOSIS — Z23 Encounter for immunization: Secondary | ICD-10-CM | POA: Diagnosis not present

## 2019-10-22 DIAGNOSIS — H9313 Tinnitus, bilateral: Secondary | ICD-10-CM | POA: Diagnosis not present

## 2019-10-22 DIAGNOSIS — H6123 Impacted cerumen, bilateral: Secondary | ICD-10-CM | POA: Diagnosis not present

## 2020-02-17 DIAGNOSIS — N898 Other specified noninflammatory disorders of vagina: Secondary | ICD-10-CM | POA: Diagnosis not present

## 2020-02-17 DIAGNOSIS — Z113 Encounter for screening for infections with a predominantly sexual mode of transmission: Secondary | ICD-10-CM | POA: Diagnosis not present

## 2020-02-17 DIAGNOSIS — N9089 Other specified noninflammatory disorders of vulva and perineum: Secondary | ICD-10-CM | POA: Diagnosis not present

## 2020-02-17 DIAGNOSIS — K649 Unspecified hemorrhoids: Secondary | ICD-10-CM | POA: Diagnosis not present

## 2020-02-24 DIAGNOSIS — E7800 Pure hypercholesterolemia, unspecified: Secondary | ICD-10-CM | POA: Diagnosis not present

## 2020-02-24 DIAGNOSIS — E785 Hyperlipidemia, unspecified: Secondary | ICD-10-CM | POA: Diagnosis not present

## 2020-02-24 DIAGNOSIS — Z8672 Personal history of thrombophlebitis: Secondary | ICD-10-CM | POA: Diagnosis not present

## 2020-02-24 DIAGNOSIS — E559 Vitamin D deficiency, unspecified: Secondary | ICD-10-CM | POA: Diagnosis not present

## 2020-02-24 DIAGNOSIS — E039 Hypothyroidism, unspecified: Secondary | ICD-10-CM | POA: Diagnosis not present

## 2020-02-24 DIAGNOSIS — I8391 Asymptomatic varicose veins of right lower extremity: Secondary | ICD-10-CM | POA: Diagnosis not present

## 2020-04-10 ENCOUNTER — Other Ambulatory Visit: Payer: Self-pay

## 2020-04-10 DIAGNOSIS — Z113 Encounter for screening for infections with a predominantly sexual mode of transmission: Secondary | ICD-10-CM | POA: Diagnosis not present

## 2020-04-10 DIAGNOSIS — Z30432 Encounter for removal of intrauterine contraceptive device: Secondary | ICD-10-CM | POA: Diagnosis not present

## 2020-04-11 ENCOUNTER — Other Ambulatory Visit: Payer: Self-pay

## 2020-04-14 ENCOUNTER — Ambulatory Visit (INDEPENDENT_AMBULATORY_CARE_PROVIDER_SITE_OTHER): Payer: Medicaid Other | Admitting: Gastroenterology

## 2020-04-14 ENCOUNTER — Encounter: Payer: Self-pay | Admitting: Gastroenterology

## 2020-04-14 ENCOUNTER — Other Ambulatory Visit: Payer: Self-pay

## 2020-04-14 VITALS — BP 100/64 | HR 92 | Temp 98.0°F | Ht 61.0 in | Wt 135.1 lb

## 2020-04-14 DIAGNOSIS — K641 Second degree hemorrhoids: Secondary | ICD-10-CM

## 2020-04-14 NOTE — Progress Notes (Signed)
Cephas Darby, MD [address]  [place]  [place], [place] [address]  Main: [phone]  Fax: [phone]    Gastroenterology Consultation  Referring Provider:     Crawford Givens, MD Primary Care Physician:  Ma Hillock, DO Primary Gastroenterologist:  Dr. Cephas Darby Reason for Consultation:     Prolapse of the hemorrhoids        HPI:   Amar Keenum is a 29 y.o. female referred by Dr. Raoul Pitch, Reinaldo Raddle, DO  for consultation & management of external hemorrhoids.  Patient reports that for last 6 years since she delivered her first child, she has been experiencing protrusion of tissue during bowel movement that recedes spontaneously after a bowel movement.  She denies rectal pain, pressure, bleeding, itching, weight loss, abdominal pain.  She feels it as annoying.  She is interested in hemorrhoid ligation  She does not smoke or drink alcohol She works as a Chief Operating Officer twice a week  NSAIDs: None  Antiplts/Anticoagulants/Anti thrombotics: None  GI Procedures: None She denies family history of GI malignancy  Past Medical History:  Diagnosis Date  . Allergy   . Asthma   . Chronic constipation   . Cough 06/07/2008   Qualifier: Diagnosis of  By: Diona Browner MD, Amy    . GERD (gastroesophageal reflux disease)   . Hemorrhoid   . History of chlamydia 06/2011   treated with doxy  . Pregnancy 11/17/2017  . Superficial thrombophlebitis 09/2015   right LE    Past Surgical History:  Procedure Laterality Date  . MOUTH SURGERY    . WISDOM TOOTH EXTRACTION      Current Outpatient Medications:  .  albuterol (PROVENTIL HFA;VENTOLIN HFA) 108 (90 Base) MCG/ACT inhaler, Inhale 2 puffs into the lungs every 6 (six) hours as needed for wheezing or shortness of breath., Disp: 1 Inhaler, Rfl: 0 .  aspirin 81 MG chewable tablet, Baby Aspirin 81 mg chewable tablet  Chew 1 tablet every day by oral route., Disp: , Rfl:  .  atorvastatin (LIPITOR) 20 MG tablet, Take 20 mg by mouth  daily., Disp: , Rfl:  .  cetirizine (ZYRTEC) 10 MG tablet, Take 1 tablet (10 mg total) by mouth daily., Disp: 90 tablet, Rfl: 3   Family History  Problem Relation Age of Onset  . Breast cancer Neg Hx   . Colon cancer Neg Hx   . Heart disease Neg Hx      Social History   Tobacco Use  . Smoking status: Never Smoker  . Smokeless tobacco: Never Used  Vaping Use  . Vaping Use: Never used  Substance Use Topics  . Alcohol use: Yes    Alcohol/week: 14.0 standard drinks    Types: 14 Glasses of wine per week  . Drug use: No    Allergies as of 04/14/2020 - Review Complete 04/14/2020  Allergen Reaction Noted  . Depo-provera [medroxyprogesterone] Other (See Comments) 05/23/2014    Review of Systems:    All systems reviewed and negative except where noted in HPI.   Physical Exam:  BP 100/64 (BP Location: Left Arm, Patient Position: Sitting, Cuff Size: Normal)   Pulse 92   Temp 98 F (36.7 C) (Oral)   Ht 5\' 1"  (1.549 m)   Wt 135 lb 2 oz (61.3 kg)   BMI 25.53 kg/m  No LMP recorded. (Menstrual status: IUD).  General:   Alert,  Well-developed, well-nourished, pleasant and cooperative in NAD Head:  Normocephalic and atraumatic. Eyes:  Sclera clear, no  icterus.   Conjunctiva pink. Ears:  Normal auditory acuity. Nose:  No deformity, discharge, or lesions. Mouth:  No deformity or lesions,oropharynx pink & moist. Neck:  Supple; no masses or thyromegaly. Lungs:  Respirations even and unlabored.  Clear throughout to auscultation.   No wheezes, crackles, or rhonchi. No acute distress. Heart:  Regular rate and rhythm; no murmurs, clicks, rubs, or gallops. Abdomen:  Normal bowel sounds. Soft, non-tender and non-distended without masses, hepatosplenomegaly or hernias noted.  No guarding or rebound tenderness.   Rectal: Normal perianal exam, nontender digital rectal exam, anoscopy revealed external hemorrhoids Msk:  Symmetrical without gross deformities. Good, equal movement & strength  bilaterally. Pulses:  Normal pulses noted. Extremities:  No clubbing or edema.  No cyanosis. Neurologic:  Alert and oriented x3;  grossly normal neurologically. Skin:  Intact without significant lesions or rashes. No jaundice. Psych:  Alert and cooperative. Normal mood and affect.  Imaging Studies: No abdominal imaging  Assessment and Plan:   Laurencia Roma is a 29 y.o. female with no significant past medical history is seen in consultation for grade 2 external hemorrhoids  Grade 2 hemorrhoids, patient is concerned about hemorrhoid prolapse Discussed about hemorrhoid ligation which may partially reduce the prolapse, may not completely resolve the underlying problem.  Patient is willing to try, risks and benefits discussed Consent obtained, perform hemorrhoid ligation today   Follow up in 3 weeks   Cephas Darby, MD

## 2020-04-23 DIAGNOSIS — J4521 Mild intermittent asthma with (acute) exacerbation: Secondary | ICD-10-CM | POA: Diagnosis not present

## 2020-04-23 DIAGNOSIS — Z20822 Contact with and (suspected) exposure to covid-19: Secondary | ICD-10-CM | POA: Diagnosis not present

## 2020-04-23 DIAGNOSIS — B349 Viral infection, unspecified: Secondary | ICD-10-CM | POA: Diagnosis not present

## 2020-04-23 DIAGNOSIS — R059 Cough, unspecified: Secondary | ICD-10-CM | POA: Diagnosis not present

## 2020-04-26 ENCOUNTER — Encounter: Payer: Self-pay | Admitting: Emergency Medicine

## 2020-04-26 ENCOUNTER — Emergency Department: Payer: Medicaid Other

## 2020-04-26 ENCOUNTER — Emergency Department
Admission: EM | Admit: 2020-04-26 | Discharge: 2020-04-26 | Disposition: A | Discharge status: Home / Self Care | Payer: Medicaid Other | Attending: Student in an Organized Health Care Education/Training Program | Admitting: Student in an Organized Health Care Education/Training Program

## 2020-04-26 ENCOUNTER — Other Ambulatory Visit: Payer: Self-pay

## 2020-04-26 DIAGNOSIS — Z20822 Contact with and (suspected) exposure to covid-19: Secondary | ICD-10-CM | POA: Insufficient documentation

## 2020-04-26 DIAGNOSIS — R059 Cough, unspecified: Secondary | ICD-10-CM

## 2020-04-26 DIAGNOSIS — J9801 Acute bronchospasm: Secondary | ICD-10-CM | POA: Insufficient documentation

## 2020-04-26 DIAGNOSIS — Z7982 Long term (current) use of aspirin: Secondary | ICD-10-CM | POA: Diagnosis not present

## 2020-04-26 LAB — RESP PANEL BY RT-PCR (FLU A&B, COVID) ARPGX2
Influenza A by PCR: NEGATIVE
Influenza B by PCR: NEGATIVE
SARS Coronavirus 2 by RT PCR: NEGATIVE

## 2020-04-26 MED ORDER — BENZONATATE 100 MG PO CAPS: ORAL_CAPSULE | ORAL | 0 refills | Status: DC

## 2020-04-26 NOTE — ED Triage Notes (Signed)
Cough x 3 weeks. Seen at [facility] for same on Sunday.  AAOx3.  Skin warm and dry. NAD

## 2020-04-26 NOTE — Discharge Instructions (Signed)
Your exam and CXR are normal at this time. Take the prescription cough medicine as needed. Restart your daily OTC allergy meds as discussed. Follow-up with your provider as needed.

## 2020-04-27 NOTE — ED Provider Notes (Signed)
[facility] Emergency Department Provider Note ____________________________________________  Time seen: 1032  I have reviewed the triage vital signs and the nursing notes.  HISTORY  Chief Complaint  Cough  HPI Julia Lyons is a 29 y.o. female with a history of asthma, presents to the ED for evaluation of 3-week complaint of persistent cough.  Patient was apparently evaluated at local urgent care for the same symptoms.  She was started on a prednisone taper as well as a refill on her albuterol inhaler.  She reports continued cough but notes the cough is dry nonproductive.  She denies any taste/smell sensation.  She was tested for Covid on Sunday with a home test which was negative.   Past Medical History:  Diagnosis Date  . Allergy   . Asthma   . Chronic constipation   . Cough 06/07/2008   Qualifier: Diagnosis of  By: Diona Browner MD, Amy    . GERD (gastroesophageal reflux disease)   . Hemorrhoid   . History of chlamydia 06/2011   treated with doxy  . Pregnancy 11/17/2017  . Superficial thrombophlebitis 09/2015   right LE    Patient Active Problem List   Diagnosis Date Noted  . 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 Blood type O+ 07/21/2017  . Nasal polyp 09/27/2016  . Seasonal allergic rhinitis due to pollen 09/27/2016  . Exercise-induced asthma 10/22/2011    Past Surgical History:  Procedure Laterality Date  . MOUTH SURGERY    . WISDOM TOOTH EXTRACTION      Prior to Admission medications   Medication Sig Start Date End Date Taking? Authorizing Provider  albuterol (PROVENTIL HFA;VENTOLIN HFA) 108 (90 Base) MCG/ACT inhaler Inhale 2 puffs into the lungs every 6 (six) hours as needed for wheezing or shortness of breath. 08/21/18   Kuneff, Renee A, DO  aspirin 81 MG chewable tablet Baby Aspirin 81 mg chewable tablet  Chew 1 tablet every day by oral route.    [provider]  atorvastatin (LIPITOR) 20 MG tablet Take 20 mg by mouth  daily. 02/24/20   [provider]  benzonatate (TESSALON PERLES) 100 MG capsule Take 1-2 tabs TID prn cough 04/26/20   Euclide Granito, Dannielle Karvonen, PA-C  cetirizine (ZYRTEC) 10 MG tablet Take 1 tablet (10 mg total) by mouth daily. 03/11/18   Kuneff, Renee A, DO    Allergies Depo-provera [medroxyprogesterone]  Family History  Problem Relation Age of Onset  . Breast cancer Neg Hx   . Colon cancer Neg Hx   . Heart disease Neg Hx     Social History Social History   Tobacco Use  . Smoking status: Never Smoker  . Smokeless tobacco: Never Used  Vaping Use  . Vaping Use: Never used  Substance Use Topics  . Alcohol use: Yes    Alcohol/week: 14.0 standard drinks    Types: 14 Glasses of wine per week  . Drug use: No    Review of Systems  Constitutional: Negative for fever. Eyes: Negative for visual changes. ENT: Negative for sore throat. Cardiovascular: Negative for chest pain. Respiratory: Negative for shortness of breath. Reports cough as above.  Gastrointestinal: Negative for abdominal pain, vomiting and diarrhea. Genitourinary: Negative for dysuria. Musculoskeletal: Negative for back pain. Skin: Negative for rash. Neurological: Negative for headaches, focal weakness or numbness. ____________________________________________  PHYSICAL EXAM:  VITAL SIGNS: ED Triage Vitals  Enc Vitals Group     BP 04/26/20 0944 95/70     Pulse Rate 04/26/20 0944 86  Resp 04/26/20 0944 16     Temp 04/26/20 0944 98 F (36.7 C)     Temp Source 04/26/20 0944 Oral     SpO2 04/26/20 0944 98 %     Weight 04/26/20 0942 135 lb 2.3 oz (61.3 kg)     Height 04/26/20 0942 5\' 1"  (1.549 m)     Head Circumference --      Peak Flow --      Pain Score 04/26/20 0942 0     Pain Loc --      Pain Edu? --      Excl. in [place]? --     Constitutional: Alert and oriented. Well appearing and in no distress. Head: Normocephalic and atraumatic. Eyes: Conjunctivae are normal. Normal extraocular  movements Ears: Canals clear. TMs intact bilaterally. Nose: No congestion/rhinorrhea/epistaxis. Mouth/Throat: Mucous membranes are moist. Neck: Supple. No thyromegaly. Hematological/Lymphatic/Immunological: No cervical lymphadenopathy. Cardiovascular: Normal rate, regular rhythm. Normal distal pulses. Respiratory: Normal respiratory effort. No wheezes/rales/rhonchi. Gastrointestinal: Soft and nontender. No distention. Musculoskeletal: Nontender with normal range of motion in all extremities.  Neurologic:  Normal gait without ataxia. Normal speech and language. No gross focal neurologic deficits are appreciated. Skin:  Skin is warm, dry and intact. No rash noted. Psychiatric: Mood and affect are normal. Patient exhibits appropriate insight and judgment. ____________________________________________   LABS (pertinent positives/negatives) Labs Reviewed  RESP PANEL BY RT-PCR (FLU A&B, COVID) ARPGX2  ____________________________________________   RADIOLOGY  CXR  Negative ____________________________________________  PROCEDURES  Procedures ____________________________________________  INITIAL IMPRESSION / ASSESSMENT AND PLAN / ED COURSE  DDX: CAP, COVID, flu, bronchitis, bronchospasm  Patient with ED evaluation of persistent cough in the last 3 weeks.  Patient been afebrile during the course, but does note a persistent worsening cough.  She has been without taste/smell sensation, and previously tested negative with an at-home test for Covid.  She is reassured by her again negative viral screen today as well as a reassuring chest x-ray.  Should be discharged with instructions to continue with previously prescribed steroid and inhaler.  Will be given a prescription for Tessalon Perles and advised take over-the-counter Delsym for ongoing cough.  She should rest, hydrate, and follow with primary provider for ongoing symptoms.  Return precautions have been discussed.  Julia Lyons  was evaluated in Emergency Department on 04/27/2020 for the symptoms described in the history of present illness. She was evaluated in the context of the global COVID-19 pandemic, which necessitated consideration that the patient might be at risk for infection with the SARS-CoV-2 virus that causes COVID-19. Institutional protocols and algorithms that pertain to the evaluation of patients at risk for COVID-19 are in a state of rapid change based on information released by regulatory bodies including the CDC and federal and state organizations. These policies and algorithms were followed during the patient's care in the ED. ____________________________________________  FINAL CLINICAL IMPRESSION(S) / ED DIAGNOSES  Final diagnoses:  Cough  Bronchospasm, acute      Carmie End, Dannielle Karvonen, PA-C 04/27/20 1553    Merlyn Lot, MD 04/27/20 1743

## 2020-05-08 ENCOUNTER — Other Ambulatory Visit: Payer: Self-pay

## 2020-05-12 ENCOUNTER — Other Ambulatory Visit: Payer: Self-pay

## 2020-05-12 ENCOUNTER — Encounter: Payer: Self-pay | Admitting: Gastroenterology

## 2020-05-12 ENCOUNTER — Ambulatory Visit (INDEPENDENT_AMBULATORY_CARE_PROVIDER_SITE_OTHER): Payer: Medicaid Other | Admitting: Gastroenterology

## 2020-05-12 VITALS — BP 94/56 | HR 77 | Temp 97.6°F | Ht 61.0 in | Wt 136.4 lb

## 2020-05-12 DIAGNOSIS — K641 Second degree hemorrhoids: Secondary | ICD-10-CM

## 2020-05-12 NOTE — Progress Notes (Signed)
PROCEDURE NOTE: The patient presents with symptomatic grade 2 hemorrhoids, unresponsive to maximal medical therapy, requesting rubber band ligation of his/her hemorrhoidal disease.  All risks, benefits and alternative forms of therapy were described and informed consent was obtained.  In the Left Lateral Decubitus position (if anoscopy is performed) anoscopic examination revealed grade 2 hemorrhoids in the RA and RP position(s).   The decision was made to band the RP internal hemorrhoid, and the Tama was used to perform band ligation without complication.  Digital anorectal examination was then performed to assure proper positioning of the band, and to adjust the banded tissue as required.  The patient was discharged home without pain or other issues.  Dietary and behavioral recommendations were given and (if necessary - prescriptions were given), along with follow-up instructions.  The patient will return 3 weeks for follow-up and possible additional banding as required.  No complications were encountered and the patient tolerated the procedure well.

## 2020-05-12 NOTE — Progress Notes (Signed)
PROCEDURE NOTE: The patient presents with symptomatic grade 2 hemorrhoids, unresponsive to maximal medical therapy, requesting rubber band ligation of his/her hemorrhoidal disease.  All risks, benefits and alternative forms of therapy were described and informed consent was obtained.  In the Left Lateral Decubitus position (if anoscopy is performed) anoscopic examination revealed grade 2 hemorrhoids in the RA and RP position(s).   The decision was made to band the RA internal hemorrhoid, and the Momence was used to perform band ligation without complication.  Digital anorectal examination was then performed to assure proper positioning of the band, and to adjust the banded tissue as required.  The patient was discharged home without pain or other issues.  Dietary and behavioral recommendations were given and (if necessary - prescriptions were given), along with follow-up instructions.  The patient will return  as needed for follow-up and possible additional banding as required.  No complications were encountered and the patient tolerated the procedure well.

## 2020-07-07 DIAGNOSIS — E039 Hypothyroidism, unspecified: Secondary | ICD-10-CM | POA: Diagnosis not present

## 2020-07-07 DIAGNOSIS — E7800 Pure hypercholesterolemia, unspecified: Secondary | ICD-10-CM | POA: Diagnosis not present

## 2020-07-07 DIAGNOSIS — J45909 Unspecified asthma, uncomplicated: Secondary | ICD-10-CM | POA: Diagnosis not present

## 2020-07-07 DIAGNOSIS — F419 Anxiety disorder, unspecified: Secondary | ICD-10-CM | POA: Diagnosis not present

## 2020-07-07 DIAGNOSIS — R5383 Other fatigue: Secondary | ICD-10-CM | POA: Diagnosis not present

## 2020-07-07 DIAGNOSIS — E785 Hyperlipidemia, unspecified: Secondary | ICD-10-CM | POA: Diagnosis not present

## 2020-07-07 DIAGNOSIS — E559 Vitamin D deficiency, unspecified: Secondary | ICD-10-CM | POA: Diagnosis not present

## 2020-07-11 DIAGNOSIS — R141 Gas pain: Secondary | ICD-10-CM | POA: Diagnosis not present

## 2020-07-11 DIAGNOSIS — R140 Abdominal distension (gaseous): Secondary | ICD-10-CM | POA: Diagnosis not present

## 2020-07-11 DIAGNOSIS — R12 Heartburn: Secondary | ICD-10-CM | POA: Diagnosis not present

## 2020-07-11 DIAGNOSIS — R109 Unspecified abdominal pain: Secondary | ICD-10-CM | POA: Diagnosis not present

## 2020-07-17 ENCOUNTER — Other Ambulatory Visit: Payer: Self-pay

## 2020-07-17 ENCOUNTER — Ambulatory Visit: Payer: Medicaid Other | Admitting: Dermatology

## 2020-07-17 DIAGNOSIS — B078 Other viral warts: Secondary | ICD-10-CM

## 2020-07-17 DIAGNOSIS — L409 Psoriasis, unspecified: Secondary | ICD-10-CM

## 2020-07-17 MED ORDER — MOMETASONE FUROATE 0.1 % EX SOLN: Freq: Every day | CUTANEOUS | 2 refills | Status: DC

## 2020-07-17 NOTE — Progress Notes (Signed)
   New Patient Visit  Subjective  Julia Lyons is a 30 y.o. female who presents for the following: Warts (Wart on the Right lower leg, tried otc products with a poor response ). Check a rash on the scalp, pt think this could be Psoriasis, pt has a family hx of Psoriasis Mom and Aunt.   The following portions of the chart were reviewed this encounter and updated as appropriate:   Tobacco  Allergies  Meds  Problems  Med Hx  Surg Hx  Fam Hx     Review of Systems:  No other skin or systemic complaints except as noted in HPI or Assessment and Plan.  Objective  Well appearing patient in no apparent distress; mood and affect are within normal limits.  A focused examination was performed including right lower leg, scalp . Relevant physical exam findings are noted in the Assessment and Plan.  Objective  Right Lower Leg - Anterior: Verrucous papules -- Discussed viral etiology and contagion.   Objective  Scalp, right lower leg: Well-demarcated erythematous papules/plaques with silvery scale, guttate pink scaly papules.    Assessment & Plan  Viral warts Right Lower Leg - Anterior Wart vs ISK  Discussed viral etiology and risk of spread.  Discussed multiple treatments may be required to clear warts.  Discussed possible post-treatment dyspigmentation and risk of recurrence.   Destruction of lesion - Right Lower Leg - Anterior Complexity: simple   Destruction method: cryotherapy   Informed consent: discussed and consent obtained   Timeout:  patient name, date of birth, surgical site, and procedure verified Lesion destroyed using liquid nitrogen: Yes   Region frozen until ice ball extended beyond lesion: Yes   Outcome: patient tolerated procedure well with no complications   Post-procedure details: wound care instructions given    Psoriasis Scalp, right lower leg Psoriasis is a chronic non-curable, but treatable genetic/hereditary disease that may have other systemic  features affecting other organ systems such as joints (Psoriatic Arthritis). It is associated with an increased risk of inflammatory bowel disease, heart disease, non-alcoholic fatty liver disease, and depression.     Recommend daily shampoo with Salicylic acid shampoo  Start Mometasone solution apply to scalp daily at bedtime for 2 weeks, then decrease to 5 nights a week   Ordered Medications: mometasone (ELOCON) 0.1 % lotion  Return in about 2 months (around 09/14/2020).  IMarye Round, CMA, am acting as scribe for Sarina Ser, MD .  Documentation: I have reviewed the above documentation for accuracy and completeness, and I agree with the above.  Sarina Ser, MD

## 2020-07-17 NOTE — Patient Instructions (Signed)
Start Mometasone solution apply to scalp daily at bedtime for 2 weeks, then decrease to 5 nights a week   Start over the salicylic acid shampoo daily   Cryotherapy Aftercare  . Wash gently with soap and water everyday.   Marland Kitchen Apply Vaseline and Band-Aid daily until healed. Marland Kitchen

## 2020-07-18 ENCOUNTER — Encounter: Payer: Self-pay | Admitting: Dermatology

## 2020-07-18 DIAGNOSIS — J301 Allergic rhinitis due to pollen: Secondary | ICD-10-CM | POA: Diagnosis not present

## 2020-07-18 DIAGNOSIS — J310 Chronic rhinitis: Secondary | ICD-10-CM | POA: Diagnosis not present

## 2020-07-20 ENCOUNTER — Encounter: Payer: Self-pay | Admitting: General Surgery

## 2020-07-20 ENCOUNTER — Other Ambulatory Visit: Payer: Self-pay

## 2020-07-20 ENCOUNTER — Ambulatory Visit (INDEPENDENT_AMBULATORY_CARE_PROVIDER_SITE_OTHER): Payer: Medicaid Other | Admitting: General Surgery

## 2020-07-20 VITALS — BP 112/81 | HR 75 | Temp 98.0°F | Ht 61.5 in | Wt 137.0 lb

## 2020-07-20 DIAGNOSIS — K8020 Calculus of gallbladder without cholecystitis without obstruction: Secondary | ICD-10-CM | POA: Diagnosis not present

## 2020-07-20 NOTE — Progress Notes (Signed)
Patient ID: Julia Lyons, female   DOB: 1990-11-01, 30 y.o.   MRN: [id number]  No chief complaint on file.   HPI Julia Lyons is a 30 y.o. female.   She has been referred by her primary care provider for further evaluation of cholelithiasis. The patient reports that her primary complaint has been mid to lower back pain for the past 2 months that seems to be exacerbated by standing for long periods of time. Then, starting about 2 weeks ago, she had episodes of bloating after eating. She says that she is not entirely sure what foods seem to trigger this, but her most recent episode seemed to occur after eating macaroni and cheese. She is wondering if it could be a gluten intolerance, as she has also had diarrhea for the past several months. She has not experienced any nausea or vomiting. No epigastric or right upper quadrant pain. She was seen by her primary care provider who ordered a right upper quadrant ultrasound. This demonstrated cholelithiasis without concern for biliary obstruction or cholecystitis. She has been referred to general surgery for further evaluation and management. She states that she has not had previous episodes of similar pain. She has never had jaundice or pancreatitis. She says that she is anxious about the possibility of surgery.   Past Medical History:  Diagnosis Date  . Allergy   . Asthma   . Chronic constipation   . Cough 06/07/2008   Qualifier: Diagnosis of  By: Diona Browner MD, Amy    . Hemorrhoid   . History of chlamydia 06/2011   treated with doxy  . Pregnancy 11/17/2017  . Superficial thrombophlebitis 09/2015   right LE    Past Surgical History:  Procedure Laterality Date  . MOUTH SURGERY    . WISDOM TOOTH EXTRACTION      Family History  Problem Relation Age of Onset  . Breast cancer Neg Hx   . Colon cancer Neg Hx   . Heart disease Neg Hx     Social History Social History   Tobacco Use  . Smoking status: Never Smoker  . Smokeless tobacco:  Never Used  Vaping Use  . Vaping Use: Never used  Substance Use Topics  . Alcohol use: Yes    Alcohol/week: 14.0 standard drinks    Types: 14 Glasses of wine per week  . Drug use: No    Allergies  Allergen Reactions  . Depo-Provera [Medroxyprogesterone] Other (See Comments)    Severe migraine and muscle spasms    Current Outpatient Medications  Medication Sig Dispense Refill  . albuterol (PROVENTIL HFA;VENTOLIN HFA) 108 (90 Base) MCG/ACT inhaler Inhale 2 puffs into the lungs every 6 (six) hours as needed for wheezing or shortness of breath. 1 Inhaler 0  . aspirin 81 MG chewable tablet Baby Aspirin 81 mg chewable tablet  Chew 1 tablet every day by oral route.    Marland Kitchen atorvastatin (LIPITOR) 20 MG tablet Take 20 mg by mouth daily.    . cetirizine (ZYRTEC) 10 MG tablet Take 1 tablet (10 mg total) by mouth daily. 90 tablet 3  . cholecalciferol (D-VI-SOL) 10 MCG/ML LIQD Vitamin D    . mometasone (ELOCON) 0.1 % lotion Apply topically daily. Apply to scalp once a day at bedtime for 2 weeks, then decrease to 5 nights a week 60 mL 2  . Omega-3 Fatty Acids (FISH OIL PO) Take by mouth.    . Probiotic Product (PROBIOTIC DAILY PO) Take by mouth.     No current facility-administered  medications for this visit.    Review of Systems Review of Systems  Gastrointestinal: Positive for diarrhea.       Bloating  Skin:       Psoriasis  Psychiatric/Behavioral: The patient is nervous/anxious.   All other systems reviewed and are negative.   Blood pressure 112/81, pulse 75, temperature 98 F (36.7 C), height 5' 1.5" (1.562 m), weight 137 lb (62.1 kg), last menstrual period 07/04/2020, SpO2 96 %, currently breastfeeding.  Physical Exam Physical Exam Constitutional:      General: She is not in acute distress.    Appearance: Normal appearance. She is normal weight.  HENT:     Head: Normocephalic and atraumatic.     Nose:     Comments: Covered with a mask    Mouth/Throat:     Comments: Covered  with a mask Eyes:     General: No scleral icterus.       Right eye: No discharge.        Left eye: No discharge.  Neck:     Comments: The trachea is midline. No palpable cervical or supraclavicular lymphadenopathy. No dominant thyroid masses or thyromegaly appreciated. The gland moves freely with deglutition. Cardiovascular:     Rate and Rhythm: Normal rate and regular rhythm.     Pulses: Normal pulses.  Pulmonary:     Effort: Pulmonary effort is normal.     Breath sounds: Normal breath sounds.  Abdominal:     General: Abdomen is flat. Bowel sounds are normal. There is no distension.     Palpations: Abdomen is soft.     Tenderness: There is no abdominal tenderness. There is no guarding.  Genitourinary:    Comments: Deferred Musculoskeletal:        General: No swelling or tenderness.  Skin:    General: Skin is warm and dry.  Neurological:     General: No focal deficit present.     Mental Status: She is alert and oriented to person, place, and time.  Psychiatric:        Mood and Affect: Mood normal.        Behavior: Behavior normal.     Data Reviewed I reviewed the faxed notes from her primary care provider's office.  Included in the documents is the report of a right upper quadrant ultrasound performed on July 10, 2020.  That describes cholelithiasis without cholecystitis or biliary ductal dilatation.  Other findings on the exam were normal.  In addition, there were some labs from 2021.  On 1 testing occasion (collected February 24, 2020), she did have a bilirubin of 2.4, but no other abnormalities were seen on her comprehensive metabolic panel.  I do not see any more recent labs than these  Assessment This is a 30 year old woman who presented with complaints of lower and mid back pain after standing for prolonged periods, as well as abdominal bloating.  While she does have cholelithiasis, her symptoms are not classic for biliary colic.  She is reluctant to undergo surgical  intervention.  Plan I discussed the nature of cholelithiasis/gallstone disease with the patient.  I emphasized that many people have gallstones but never become symptomatic from them.  I also discussed some of the classic symptomology of biliary colic, specifically right upper quadrant and epigastric pain, potentially radiating through to the back or around under the rib cage.  This is classically brought on by eating fatty or fried foods.  There is often accompanying nausea and/or vomiting.  While it is  certainly possible that her bloating sensation is secondary to cholelithiasis, I think that there may be a better explanation, such as specific food intolerance.  I have asked her to keep track of her diet and note what foods she has not eaten when she experiences the symptoms of bloating.  I also discussed with her in abbreviated detail the nature of cholecystectomy and potential risks/benefits and long-term consequences of having once gallbladder removed.  At this time, she does not wish to go forward with surgery, but will let me know if her symptoms change or she develops a more consistent pattern of abdominal pain.  She will discuss her symptoms further with her primary care provider.  I will see her on an as-needed basis.    Fredirick Maudlin 07/20/2020, 1:27 PM

## 2020-07-20 NOTE — Patient Instructions (Signed)
You may want to keep a food diary of what you are eating and if you have any symptoms.   Follow-up with our office as needed.  Please call and ask to speak with a nurse if you develop questions or concerns.   Cholelithiasis  Cholelithiasis happens when gallstones form in the gallbladder. The gallbladder stores bile. Bile is a fluid that helps digest fats. Bile can harden and form into gallstones. If they cause a blockage, they can cause pain (gallbladder attack). What are the causes? This condition may be caused by:  Some blood diseases, such as sickle cell anemia.  Too much of a fat-like substance (cholesterol) in your bile.  Not enough bile salts in your bile. These salts help the body absorb and digest fats.  The gallbladder not emptying fully or often enough. This is common in pregnant women. What increases the risk? The following factors may make you more likely to develop this condition:  Being female.  Being pregnant many times.  Eating a lot of fried foods, fat, and refined carbs (refined carbohydrates).  Being very overweight (obese).  Being older than age 61.  Using medicines with female hormones in them for a long time.  Losing weight fast.  Having gallstones in your family.  Having some health problems, such as diabetes, Crohn's disease, or liver disease. What are the signs or symptoms? Often, there may be gallstones but no symptoms. These gallstones are called silent gallstones. If a gallstone causes a blockage, you may get sudden pain. The pain:  Can be in the upper right part of your belly (abdomen).  Normally comes at night or after you eat.  Can last an hour or more.  Can spread to your right shoulder, back, or chest.  Can feel like discomfort, burning, or fullness in the upper part of your belly (indigestion). If the blockage lasts more than a few hours, you can get an infection or swelling. You may:  Feel like you may vomit.  Vomit.  Feel  bloated.  Have belly pain for 5 hours or more.  Feel tender in your belly, often in the upper right part and under your ribs.  Have fever or chills.  Have skin or the white parts of your eyes turn yellow (jaundice).  Have dark pee (urine) or pale poop (stool). How is this treated? Treatment for this condition depends on how bad you feel. If you have symptoms, you may need:  Home care, if symptoms are not very bad. ? Do not eat for 12-24 hours. Drink only water and clear liquids. ? Start to eat simple or clear foods after 1 or 2 days. Try broths and crackers. ? You may need medicines for pain or stomach upset or both. ? If you have an infection, you will need antibiotics.  A hospital stay, if you have very bad pain or a very bad infection.  Surgery to remove your gallbladder. You may need this if: ? Gallstones keep coming back. ? You have very bad symptoms.  Medicines to break up gallstones. Medicines: ? Are best for small gallstones. ? May be used for up to 6-12 months.  A procedure to find and take out gallstones or to break up gallstones. Follow these instructions at home: Medicines  Take over-the-counter and prescription medicines only as told by your doctor.  If you were prescribed an antibiotic medicine, take it as told by your doctor. Do not stop taking the antibiotic even if you start to feel better.  Ask your doctor if the medicine prescribed to you requires you to avoid driving or using machinery. Eating and drinking  Drink enough fluid to keep your urine pale yellow. Drink water or clear fluids. This is important when you have pain.  Eat healthy foods. Choose: ? Fewer fatty foods, such as fried foods. ? Fewer refined carbs. Avoid breads and grains that are highly processed, such as white bread and white rice. Choose whole grains, such as whole-wheat bread and brown rice. ? More fiber. Almonds, fresh fruit, and beans are healthy sources. General  instructions  Keep a healthy weight.  Keep all follow-up visits as told by your doctor. This is important. Where to find more information  [employer] of Diabetes and Digestive and Kidney Diseases: [email or web address] Contact a doctor if:  You have sudden pain in the upper right part of your belly. Pain might spread to your right shoulder, back, or chest.  You have been diagnosed with gallstones that have no symptoms and you get: ? Belly pain. ? Discomfort, burning, or fullness in the upper part of your abdomen.  You have dark urine or pale stools. Get help right away if:  You have sudden pain in the upper right part of your abdomen, and the pain lasts more than 2 hours.  You have pain in your abdomen, and: ? It lasts more than 5 hours. ? It keeps getting worse.  You have a fever or chills.  You keep feeling like you may vomit.  You keep vomiting.  Your skin or the white parts of your eyes turn yellow. Summary  Cholelithiasis happens when gallstones form in the gallbladder.  This condition may be caused by a blood disease, too much of a fat-like substance in the bile, or not enough bile salts in bile.  Treatment for this condition depends on how bad you feel.  If you have symptoms, do not eat or drink. You may need medicines. You may need a hospital stay for very bad pain or a very bad infection.  You may need surgery if gallstones keep coming back or if you have very bad symptoms. This information is not intended to replace advice given to you by your health care provider. Make sure you discuss any questions you have with your health care provider. Document Revised: 07/02/2019 Document Reviewed: 04/05/2019 Elsevier Patient Education  2021 [employer].

## 2020-08-11 DIAGNOSIS — Z01419 Encounter for gynecological examination (general) (routine) without abnormal findings: Secondary | ICD-10-CM | POA: Diagnosis not present

## 2020-08-11 DIAGNOSIS — Z0000 Encounter for general adult medical examination without abnormal findings: Secondary | ICD-10-CM | POA: Diagnosis not present

## 2020-08-11 DIAGNOSIS — L659 Nonscarring hair loss, unspecified: Secondary | ICD-10-CM | POA: Diagnosis not present

## 2020-09-19 ENCOUNTER — Ambulatory Visit: Payer: Medicaid Other | Admitting: Dermatology

## 2020-09-19 ENCOUNTER — Other Ambulatory Visit: Payer: Self-pay

## 2020-09-19 DIAGNOSIS — Z840 Family history of diseases of the skin and subcutaneous tissue: Secondary | ICD-10-CM

## 2020-09-19 DIAGNOSIS — L409 Psoriasis, unspecified: Secondary | ICD-10-CM | POA: Diagnosis not present

## 2020-09-19 DIAGNOSIS — B079 Viral wart, unspecified: Secondary | ICD-10-CM | POA: Diagnosis not present

## 2020-09-19 DIAGNOSIS — L810 Postinflammatory hyperpigmentation: Secondary | ICD-10-CM

## 2020-09-19 MED ORDER — KETOCONAZOLE 2 % EX SHAM: MEDICATED_SHAMPOO | CUTANEOUS | 6 refills | Status: DC

## 2020-09-19 MED ORDER — CLOBETASOL PROPIONATE 0.05 % EX SHAM: MEDICATED_SHAMPOO | CUTANEOUS | 6 refills | Status: DC

## 2020-09-19 NOTE — Progress Notes (Signed)
Follow-Up Visit   Subjective  Julia Lyons is a 30 y.o. female who presents for the following: Psoriasis (Of the scalp - current using OTC shampoos and Mometasone solution, but she continues to flare) and wart (Of the R pretibial - previously tx with LN2, patient unsure if resolved since it left a dark mark on her leg).  The following portions of the chart were reviewed this encounter and updated as appropriate:   Tobacco  Allergies  Meds  Problems  Med Hx  Surg Hx  Fam Hx     Review of Systems:  No other skin or systemic complaints except as noted in HPI or Assessment and Plan.  Objective  Well appearing patient in no apparent distress; mood and affect are within normal limits.  A focused examination was performed including the legs and scalp. Relevant physical exam findings are noted in the Assessment and Plan.  Objective  Scalp: Mild, few flakes through the scalp with no obvious plaques.  Objective  R pretibial: Some residual at the top edge.  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 Start clobetasol shampoo twice a week Continue mometasone solution up to 5 days a week as needed  Psoriasis is a chronic non-curable, but treatable genetic/hereditary disease that may have other systemic features affecting other organ systems such as joints (Psoriatic Arthritis). It is associated with an increased risk of inflammatory bowel disease, heart disease, non-alcoholic fatty liver disease, and depression.    Continue Sal. Acid shampoo use twice per week.   Start Ketoconazole 2% shampoo let sit 5-10 minutes before washing out. Use twice per week.   Start Clobetasol shampoo use twice per week. Topical steroids (such as triamcinolone, fluocinolone, fluocinonide, mometasone, clobetasol, halobetasol, betamethasone, hydrocortisone) can cause thinning and lightening of the skin  if they are used for too long in the same area. Your physician has selected the right strength medicine for your problem and area affected on the body. Please use your medication only as directed by your physician to prevent side effects.   Continue Mometasone solution to aa's scalp QD up to 5d/qk. Topical steroids (such as triamcinolone, fluocinolone, fluocinonide, mometasone, clobetasol, halobetasol, betamethasone, hydrocortisone) can cause thinning and lightening of the skin if they are used for too long in the same area. Your physician has selected the right strength medicine for your problem and area affected on the body. Please use your medication only as directed by your physician to prevent side effects.   ketoconazole (NIZORAL) 2 % shampoo - Scalp  Other Related Medications mometasone (ELOCON) 0.1 % lotion CLOBEX 0.05 % shampoo  Viral wart R pretibial With PIPA - advised patient that hyperpigmentation will fade over time.  Discussed viral etiology and risk of spread.  Discussed multiple treatments may be required to clear warts.  Discussed possible post-treatment dyspigmentation and risk of recurrence.  Destruction of lesion - R pretibial Complexity: simple   Destruction method: cryotherapy   Informed consent: discussed and consent obtained   Timeout:  patient name, date of birth, surgical site, and procedure verified Lesion destroyed using liquid nitrogen: Yes   Region frozen until ice ball extended beyond lesion: Yes   Outcome: patient tolerated procedure well with no complications   Post-procedure details: wound care instructions given    Return in about 6 months (around 03/21/2021) for psoriasis follow up .  Luther Redo, CMA, am acting as scribe for  Sarina Ser, MD .  Documentation: I have reviewed the above documentation for accuracy and completeness, and I agree with the above.  Sarina Ser, MD

## 2020-09-19 NOTE — Patient Instructions (Signed)

## 2020-09-20 ENCOUNTER — Other Ambulatory Visit: Payer: Self-pay

## 2020-09-20 DIAGNOSIS — L409 Psoriasis, unspecified: Secondary | ICD-10-CM

## 2020-09-20 MED ORDER — CLOBEX 0.05 % EX SHAM: MEDICATED_SHAMPOO | CUTANEOUS | 6 refills | Status: DC

## 2020-09-20 NOTE — Progress Notes (Signed)
Rx stamped DAW for insurance.

## 2020-09-21 ENCOUNTER — Encounter: Payer: Self-pay | Admitting: Dermatology

## 2020-10-10 DIAGNOSIS — E039 Hypothyroidism, unspecified: Secondary | ICD-10-CM | POA: Diagnosis not present

## 2020-10-10 DIAGNOSIS — E785 Hyperlipidemia, unspecified: Secondary | ICD-10-CM | POA: Diagnosis not present

## 2020-10-10 DIAGNOSIS — Z79899 Other long term (current) drug therapy: Secondary | ICD-10-CM | POA: Diagnosis not present

## 2020-10-10 DIAGNOSIS — R011 Cardiac murmur, unspecified: Secondary | ICD-10-CM | POA: Diagnosis not present

## 2020-10-10 DIAGNOSIS — F419 Anxiety disorder, unspecified: Secondary | ICD-10-CM | POA: Diagnosis not present

## 2020-10-10 DIAGNOSIS — E7800 Pure hypercholesterolemia, unspecified: Secondary | ICD-10-CM | POA: Diagnosis not present

## 2020-10-10 DIAGNOSIS — E559 Vitamin D deficiency, unspecified: Secondary | ICD-10-CM | POA: Diagnosis not present

## 2020-10-11 DIAGNOSIS — R140 Abdominal distension (gaseous): Secondary | ICD-10-CM | POA: Diagnosis not present

## 2020-10-11 DIAGNOSIS — R1084 Generalized abdominal pain: Secondary | ICD-10-CM | POA: Diagnosis not present

## 2020-11-15 ENCOUNTER — Encounter: Payer: Self-pay | Admitting: Gastroenterology

## 2020-11-17 ENCOUNTER — Other Ambulatory Visit: Payer: Self-pay

## 2020-11-17 ENCOUNTER — Ambulatory Visit (INDEPENDENT_AMBULATORY_CARE_PROVIDER_SITE_OTHER): Payer: Medicaid Other | Admitting: Gastroenterology

## 2020-11-17 ENCOUNTER — Encounter: Payer: Self-pay | Admitting: Gastroenterology

## 2020-11-17 VITALS — BP 112/75 | HR 78 | Temp 97.6°F | Ht 61.5 in | Wt 139.1 lb

## 2020-11-17 DIAGNOSIS — K648 Other hemorrhoids: Secondary | ICD-10-CM | POA: Diagnosis not present

## 2020-11-17 DIAGNOSIS — K625 Hemorrhage of anus and rectum: Secondary | ICD-10-CM

## 2020-11-17 NOTE — Progress Notes (Signed)
Cephas Darby, MD [address]  [place]  [place], [place] [address]  Main: [phone]  Fax: [phone]    Gastroenterology Consultation  Referring Provider:     Gae Bon, NP Primary Care Physician:  Gae Bon, NP Primary Gastroenterologist:  Dr. Cephas Darby Reason for Consultation:    Rectal bleeding        HPI:   Julia Lyons is a 30 y.o. female referred by Dr. Gae Bon, NP  for consultation & management of external hemorrhoids.  Patient reports that for last 6 years since she delivered her first child, she has been experiencing protrusion of tissue during bowel movement that recedes spontaneously after a bowel movement.  She denies rectal pain, pressure, bleeding, itching, weight loss, abdominal pain.  She feels it as annoying.  She is interested in hemorrhoid ligation  She does not smoke or drink alcohol She works as a Chief Operating Officer twice a week  Follow-up visit 11/17/2020 Patient messaged me via [place] yesterday as she has been experiencing painless rectal bleeding that started Sunday, she has been noticing blood dripping into the toilet daily in the evening with a bowel movement only and she shared a picture with me today.  She denies any rectal pain, itching or any other symptoms.  She denies constipation, pushing or straining.  Patient underwent ligation of right anterior and right posterior hemorrhoids last year.  NSAIDs: None  Antiplts/Anticoagulants/Anti thrombotics: None  GI Procedures: None She denies family history of GI malignancy  Past Medical History:  Diagnosis Date   Allergy    Asthma    Chronic constipation    Cough 06/07/2008   Qualifier: Diagnosis of  By: Diona Browner MD, Amy     Hemorrhoid    History of chlamydia 06/2011   treated with doxy   Pregnancy 11/17/2017   Superficial thrombophlebitis 09/2015   right LE    Past Surgical History:  Procedure Laterality Date   MOUTH SURGERY     WISDOM TOOTH  EXTRACTION      Current Outpatient Medications:    albuterol (PROVENTIL HFA;VENTOLIN HFA) 108 (90 Base) MCG/ACT inhaler, Inhale 2 puffs into the lungs every 6 (six) hours as needed for wheezing or shortness of breath., Disp: 1 Inhaler, Rfl: 0   ALPRAZolam (XANAX) 0.25 MG tablet, Take 0.25 mg by mouth daily as needed., Disp: , Rfl:    aspirin 81 MG chewable tablet, Baby Aspirin 81 mg chewable tablet  Chew 1 tablet every day by oral route., Disp: , Rfl:    atorvastatin (LIPITOR) 10 MG tablet, Take by mouth., Disp: , Rfl:    cetirizine (ZYRTEC) 10 MG tablet, Take 1 tablet (10 mg total) by mouth daily., Disp: 90 tablet, Rfl: 3   cholecalciferol (D-VI-SOL) 10 MCG/ML LIQD, Vitamin D, Disp: , Rfl:    CLOBEX 0.05 % shampoo, Shampoo into scalp let sit 10 minutes then wash out. Use 2 times per week., Disp: 118 mL, Rfl: 6   ketoconazole (NIZORAL) 2 % shampoo, Shampoo into scalp let sit 10 minutes then wash out. Use twice per week., Disp: 120 mL, Rfl: 6   mometasone (ELOCON) 0.1 % lotion, Apply topically daily. Apply to scalp once a day at bedtime for 2 weeks, then decrease to 5 nights a week, Disp: 60 mL, Rfl: 2   Omega-3 Fatty Acids (FISH OIL PO), Take by mouth., Disp: , Rfl:    Prenatal 28-0.8 MG TABS, Take 1 tablet by mouth daily., Disp: , Rfl:  Probiotic Product (PROBIOTIC DAILY PO), Take by mouth., Disp: , Rfl:    Family History  Problem Relation Age of Onset   Breast cancer Neg Hx    Colon cancer Neg Hx    Heart disease Neg Hx      Social History   Tobacco Use   Smoking status: Never   Smokeless tobacco: Never  Vaping Use   Vaping Use: Never used  Substance Use Topics   Alcohol use: Yes    Alcohol/week: 14.0 standard drinks    Types: 14 Glasses of wine per week   Drug use: No    Allergies as of 11/17/2020 - Review Complete 11/17/2020  Allergen Reaction Noted   Depo-provera [medroxyprogesterone] Other (See Comments) 05/23/2014    Review of Systems:    All systems reviewed and  negative except where noted in HPI.   Physical Exam:  BP 112/75 (BP Location: Left Arm, Patient Position: Sitting, Cuff Size: Normal)   Pulse 78   Temp 97.6 F (36.4 C) (Oral)   Ht 5' 1.5" (1.562 m)   Wt 139 lb 2 oz (63.1 kg)   BMI 25.86 kg/m  No LMP recorded.  General:   Alert,  Well-developed, well-nourished, pleasant and cooperative in NAD Head:  Normocephalic and atraumatic. Eyes:  Sclera clear, no icterus.   Conjunctiva pink. Ears:  Normal auditory acuity. Nose:  No deformity, discharge, or lesions. Mouth:  No deformity or lesions,oropharynx pink & moist. Neck:  Supple; no masses or thyromegaly. Lungs:  Respirations even and unlabored.  Clear throughout to auscultation.   No wheezes, crackles, or rhonchi. No acute distress. Heart:  Regular rate and rhythm; no murmurs, clicks, rubs, or gallops. Abdomen:  Normal bowel sounds. Soft, non-tender and non-distended without masses, hepatosplenomegaly or hernias noted.  No guarding or rebound tenderness.   Rectal: Normal perianal exam, nontender digital rectal exam, anoscopy revealed bleeding right posterior hemorrhoid Msk:  Symmetrical without gross deformities. Good, equal movement & strength bilaterally. Pulses:  Normal pulses noted. Extremities:  No clubbing or edema.  No cyanosis. Neurologic:  Alert and oriented x3;  grossly normal neurologically. Skin:  Intact without significant lesions or rashes. No jaundice. Psych:  Alert and cooperative. Normal mood and affect.  Imaging Studies: No abdominal imaging  Assessment and Plan:   Julia Lyons is a 30 y.o. female with no significant past medical history is seen in consultation for grade 2 hemorrhoids s/p ligation of right anterior and right posterior in 2021, presented with recurrence of painless rectal bleeding  Painless rectal bleeding Check CBC today Discussed with patient about flexible sigmoidoscopy and she is agreeable Since anoscopy revealed bleeding of the right  posterior hemorrhoid during withdrawal, will proceed with ligation of right posterior hemorrhoid   Follow up based on the sigmoidoscopy   Cephas Darby, MD

## 2020-11-17 NOTE — Progress Notes (Signed)
PROCEDURE NOTE: The patient presents with symptomatic grade 2 hemorrhoids, unresponsive to maximal medical therapy, requesting rubber band ligation of his/her hemorrhoidal disease.  All risks, benefits and alternative forms of therapy were described and informed consent was obtained.  In the Left Lateral Decubitus position (if anoscopy is performed) anoscopic examination revealed grade 2 hemorrhoids in the RA and RP position(s).   The decision was made to band the RP internal hemorrhoid, and the [facility]an System was used to perform band ligation without complication.  Digital anorectal examination was then performed to assure proper positioning of the band, and to adjust the banded tissue as required.  The patient was discharged home without pain or other issues.  Dietary and behavioral recommendations were given and (if necessary - prescriptions were given), along with follow-up instructions.  The patient will return  as needed for follow-up and possible additional banding as required.  No complications were encountered and the patient tolerated the procedure well.    

## 2020-11-18 LAB — CBC
Hematocrit: 42 % (ref 34.0–46.6)
Hemoglobin: 13.4 g/dL (ref 11.1–15.9)
MCH: 28.4 pg (ref 26.6–33.0)
MCHC: 31.9 g/dL (ref 31.5–35.7)
MCV: 89 fL (ref 79–97)
Platelets: 279 10*3/uL (ref 150–450)
RBC: 4.72 x10E6/uL (ref 3.77–5.28)
RDW: 13.5 % (ref 11.7–15.4)
WBC: 7.9 10*3/uL (ref 3.4–10.8)

## 2020-11-23 ENCOUNTER — Telehealth: Payer: Self-pay | Admitting: Gastroenterology

## 2020-11-23 NOTE — Telephone Encounter (Signed)
Please call patient to reschedule her procedure.

## 2020-11-23 NOTE — Telephone Encounter (Signed)
Patient states she wanted a Friday procedure. Informed patient that Dr. Marius Ditch did not do procedures on fridays. She said what about thursdays. They only do Flexsigmoid at [facility] so my next Thursday was 9/1. I said I did not know if she wanted her to wait that long. She states she will try to get her mom to take off work on 07/12 so she can keep her child and she will keep the procedure on 07/12 for now

## 2020-12-04 ENCOUNTER — Encounter: Payer: Self-pay | Admitting: Gastroenterology

## 2020-12-05 ENCOUNTER — Ambulatory Visit
Admission: RE | Admit: 2020-12-05 | Discharge: 2020-12-05 | Disposition: A | Discharge status: Home / Self Care | Payer: Medicaid Other | Attending: Gastroenterology | Admitting: Gastroenterology

## 2020-12-05 ENCOUNTER — Encounter
Admission: RE | Disposition: A | Discharge status: Home / Self Care | Payer: Self-pay | Source: Home / Self Care | Attending: Gastroenterology

## 2020-12-05 ENCOUNTER — Encounter: Payer: Self-pay | Admitting: Anesthesiology

## 2020-12-05 ENCOUNTER — Encounter: Payer: Self-pay | Admitting: Gastroenterology

## 2020-12-05 DIAGNOSIS — Z7982 Long term (current) use of aspirin: Secondary | ICD-10-CM | POA: Diagnosis not present

## 2020-12-05 DIAGNOSIS — K626 Ulcer of anus and rectum: Secondary | ICD-10-CM | POA: Diagnosis not present

## 2020-12-05 DIAGNOSIS — Z79899 Other long term (current) drug therapy: Secondary | ICD-10-CM | POA: Diagnosis not present

## 2020-12-05 DIAGNOSIS — K625 Hemorrhage of anus and rectum: Secondary | ICD-10-CM | POA: Insufficient documentation

## 2020-12-05 DIAGNOSIS — K635 Polyp of colon: Secondary | ICD-10-CM | POA: Diagnosis not present

## 2020-12-05 DIAGNOSIS — Z888 Allergy status to other drugs, medicaments and biological substances status: Secondary | ICD-10-CM | POA: Insufficient documentation

## 2020-12-05 HISTORY — PX: FLEXIBLE SIGMOIDOSCOPY: SHX5431

## 2020-12-05 LAB — POCT PREGNANCY, URINE: Preg Test, Ur: NEGATIVE

## 2020-12-05 SURGERY — SIGMOIDOSCOPY, FLEXIBLE
Anesthesia: General

## 2020-12-05 MED ORDER — PROPOFOL 500 MG/50ML IV EMUL
INTRAVENOUS | Status: AC
  Filled 2020-12-05: qty 50

## 2020-12-05 MED ORDER — SODIUM CHLORIDE 0.9 % IV SOLN: INTRAVENOUS | Status: DC

## 2020-12-05 NOTE — H&P (Signed)
Jonathon Bellows, MD [address] [place], [place], [place], [address] [address], [place], [place], [place], [address] Phone: [phone]  Fax: [phone]  Primary Care Physician:  Gae Bon, NP   Pre-Procedure History & Physical: HPI:  Julia Lyons is a 30 y.o. female is here for a sigmoidoscopy  Past Medical History:  Diagnosis Date   Allergy    Asthma    Chronic constipation    Cough 06/07/2008   Qualifier: Diagnosis of  By: Diona Browner MD, Amy     Hemorrhoid    History of chlamydia 06/2011   treated with doxy   Pregnancy 11/17/2017   Superficial thrombophlebitis 09/2015   right LE    Past Surgical History:  Procedure Laterality Date   MOUTH SURGERY     WISDOM TOOTH EXTRACTION      Prior to Admission medications   Medication Sig Start Date End Date Taking? Authorizing Provider  atorvastatin (LIPITOR) 10 MG tablet Take by mouth.   Yes [provider]  albuterol (PROVENTIL HFA;VENTOLIN HFA) 108 (90 Base) MCG/ACT inhaler Inhale 2 puffs into the lungs every 6 (six) hours as needed for wheezing or shortness of breath. 08/21/18   Kuneff, Renee A, DO  ALPRAZolam (XANAX) 0.25 MG tablet Take 0.25 mg by mouth daily as needed. 10/10/20   [provider]  aspirin 81 MG chewable tablet Baby Aspirin 81 mg chewable tablet  Chew 1 tablet every day by oral route.    [provider]  cetirizine (ZYRTEC) 10 MG tablet Take 1 tablet (10 mg total) by mouth daily. 03/11/18   Kuneff, Renee A, DO  cholecalciferol (D-VI-SOL) 10 MCG/ML LIQD Vitamin D    [provider]  CLOBEX 0.05 % shampoo Shampoo into scalp let sit 10 minutes then wash out. Use 2 times per week. 09/20/20   Ralene Bathe, MD  ketoconazole (NIZORAL) 2 % shampoo Shampoo into scalp let sit 10 minutes then wash out. Use twice per week. 09/19/20   Ralene Bathe, MD  mometasone (ELOCON) 0.1 % lotion Apply topically daily. Apply to scalp once a day at bedtime for 2 weeks,  then decrease to 5 nights a week 07/17/20   Ralene Bathe, MD  Omega-3 Fatty Acids (FISH OIL PO) Take by mouth.    [provider]  Prenatal 28-0.8 MG TABS Take 1 tablet by mouth daily.    [provider]  Probiotic Product (PROBIOTIC DAILY PO) Take by mouth.    [provider]    Allergies as of 11/17/2020 - Review Complete 11/17/2020  Allergen Reaction Noted   Depo-provera [medroxyprogesterone] Other (See Comments) 05/23/2014    Family History  Problem Relation Age of Onset   Breast cancer Neg Hx    Colon cancer Neg Hx    Heart disease Neg Hx     Social History   Socioeconomic History   Marital status: Single    Spouse name: Not on file   Number of children: 1   Years of education: 13   Highest education level: Not on file  Occupational History   Occupation: Server  Tobacco Use   Smoking status: Never   Smokeless tobacco: Never  Vaping Use   Vaping Use: Never used  Substance and Sexual Activity   Alcohol use: Yes    Alcohol/week: 14.0 standard drinks    Types: 14 Glasses of wine per week   Drug use: No   Sexual activity: Yes    Partners: Male  Other Topics Concern   Not on file  Social History Narrative   Single. 1 daughter Hadynn.    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    Drinks caffeine. Takes a daily vitamin.    Wears seatbelt, bicycle helmet. Smoke detector in the home.    Exercises routinely.    Feels safe in her relationships.       Social Determinants of Health   Financial Resource Strain: Not on file  Food Insecurity: Not on file  Transportation Needs: Not on file  Physical Activity: Not on file  Stress: Not on file  Social Connections: Not on file  Intimate Partner Violence: Not on file    Review of Systems: See HPI, otherwise negative ROS  Physical Exam: BP (!) 123/93   Pulse 94   Temp (!) 97.4 F (36.3 C) (Temporal)   Resp 16   Ht 5\' 1"  (1.549 m)   Wt 62.6 kg   SpO2 100%   BMI 26.07 kg/m   General:   Alert,  pleasant and cooperative in NAD Head:  Normocephalic and atraumatic. Neck:  Supple; no masses or thyromegaly. Lungs:  Clear throughout to auscultation, normal respiratory effort.    Heart:  +S1, +S2, Regular rate and rhythm, No edema. Abdomen:  Soft, nontender and nondistended. Normal bowel sounds, without guarding, and without rebound.   Neurologic:  Alert and  oriented x4;  grossly normal neurologically.  Impression/Plan: Julia Lyons is here for a flexible sigmoidoscopy to be performed for rectal bleeding   Risks, benefits, limitations, and alternatives regarding  colonoscopy have been reviewed with the patient.  Questions have been answered.  All parties agreeable.   Jonathon Bellows, MD  12/05/2020, 9:23 AM

## 2020-12-05 NOTE — Op Note (Signed)
[facility] Gastroenterology Patient Name: Julia Lyons Procedure Date: 12/05/2020 9:22 AM MRN: [id number] Account #: [id number] Date of Birth: 05/28/1990 Admit Type: Outpatient Age: 30 Room: [facility] ENDO ROOM 2 Gender: Female Note Status: Finalized Procedure:             Flexible Sigmoidoscopy Indications:           Rectal hemorrhage Providers:             Jonathon Bellows MD, MD Medicines:             None Complications:         No immediate complications. Procedure:             Pre-Anesthesia Assessment:                        - Prior to the procedure, a History and Physical was                         performed, and patient medications, allergies and                         sensitivities were reviewed. The patient's tolerance                         of previous anesthesia was reviewed.                        - The risks and benefits of the procedure and the                         sedation options and risks were discussed with the                         patient. All questions were answered and informed                         consent was obtained.                        - ASA Grade Assessment: II - A patient with mild                         systemic disease.                        After obtaining informed consent, the scope was passed                         under direct vision. The Colonoscope was introduced                         through the anus and advanced to the the left                         transverse colon. The flexible sigmoidoscopy was                         accomplished with ease. The patient tolerated the  procedure well. The quality of the bowel preparation                         was excellent. Findings:      The perianal and digital rectal examinations were normal.      A 5 mm polyp was found in the sigmoid colon. The polyp was sessile. The       polyp was removed with a cold snare. Resection and retrieval were        complete.      A single (solitary) ten mm ulcer was found in the distal rectum. No       bleeding was present. No stigmata of recent bleeding were seen.      The exam was otherwise without abnormality. Impression:            - One 5 mm polyp in the sigmoid colon, removed with a                         cold snare. Resected and retrieved.                        - A single (solitary) ulcer in the distal rectum.                        - The examination was otherwise normal. Recommendation:        - Discharge patient to home (with escort).                        - Resume previous diet today.                        - Await pathology results.                        - Return to GI office as previously scheduled. Procedure Code(s):     --- Professional ---                        [phone], Sigmoidoscopy, flexible; with removal of                         tumor(s), polyp(s), or other lesion(s) by snare                         technique Diagnosis Code(s):     --- Professional ---                        K63.5, Polyp of colon                        K62.6, Ulcer of anus and rectum                        K62.5, Hemorrhage of anus and rectum CPT copyright 2019 American Medical Association. All rights reserved. The codes documented in this report are preliminary and upon coder review may  be revised to meet current compliance requirements. Jonathon Bellows, MD Jonathon Bellows MD, MD 12/05/2020 9:43:45 AM This report has been signed electronically. Number of Addenda: 0 Note Initiated On: 12/05/2020 9:22 AM Total Procedure Duration:  0 hours 8 minutes 29 seconds  Estimated Blood Loss:  Estimated blood loss: none.      [facility]

## 2020-12-06 ENCOUNTER — Encounter: Payer: Self-pay | Admitting: Gastroenterology

## 2020-12-06 LAB — SURGICAL PATHOLOGY

## 2020-12-12 ENCOUNTER — Telehealth: Payer: Self-pay

## 2020-12-12 NOTE — Telephone Encounter (Signed)
-----   Message from Jonathon Bellows, MD sent at 12/06/2020 10:24 AM EDT ----- Inform polyp taken out yesterday was benign.  She should follow-up with Dr. Marius Ditch for her rectal bleeding

## 2020-12-12 NOTE — Telephone Encounter (Signed)
Called patient but had to leave her a voicemail to call us back. We need to let her know that her pathology showed that her polyp that was benign but that Dr. Vicente Males recommends for her to be followed up on her rectal bleeding.

## 2020-12-19 DIAGNOSIS — R140 Abdominal distension (gaseous): Secondary | ICD-10-CM | POA: Diagnosis not present

## 2020-12-19 DIAGNOSIS — K649 Unspecified hemorrhoids: Secondary | ICD-10-CM | POA: Diagnosis not present

## 2021-01-11 DIAGNOSIS — Z79899 Other long term (current) drug therapy: Secondary | ICD-10-CM | POA: Diagnosis not present

## 2021-01-11 DIAGNOSIS — I8391 Asymptomatic varicose veins of right lower extremity: Secondary | ICD-10-CM | POA: Diagnosis not present

## 2021-01-11 DIAGNOSIS — F419 Anxiety disorder, unspecified: Secondary | ICD-10-CM | POA: Diagnosis not present

## 2021-01-11 DIAGNOSIS — E785 Hyperlipidemia, unspecified: Secondary | ICD-10-CM | POA: Diagnosis not present

## 2021-01-11 DIAGNOSIS — J45909 Unspecified asthma, uncomplicated: Secondary | ICD-10-CM | POA: Diagnosis not present

## 2021-01-11 DIAGNOSIS — E7800 Pure hypercholesterolemia, unspecified: Secondary | ICD-10-CM | POA: Diagnosis not present

## 2021-01-11 DIAGNOSIS — G608 Other hereditary and idiopathic neuropathies: Secondary | ICD-10-CM | POA: Diagnosis not present

## 2021-01-11 DIAGNOSIS — E559 Vitamin D deficiency, unspecified: Secondary | ICD-10-CM | POA: Diagnosis not present

## 2021-01-11 DIAGNOSIS — R5383 Other fatigue: Secondary | ICD-10-CM | POA: Diagnosis not present

## 2021-01-11 DIAGNOSIS — E039 Hypothyroidism, unspecified: Secondary | ICD-10-CM | POA: Diagnosis not present

## 2021-01-11 DIAGNOSIS — G603 Idiopathic progressive neuropathy: Secondary | ICD-10-CM | POA: Diagnosis not present

## 2021-01-11 DIAGNOSIS — M79661 Pain in right lower leg: Secondary | ICD-10-CM | POA: Diagnosis not present

## 2021-02-13 ENCOUNTER — Encounter: Payer: Self-pay | Admitting: General Surgery

## 2021-03-22 ENCOUNTER — Ambulatory Visit: Payer: Medicaid Other | Admitting: Dermatology

## 2021-04-02 ENCOUNTER — Ambulatory Visit: Payer: Medicaid Other

## 2021-04-13 DIAGNOSIS — F419 Anxiety disorder, unspecified: Secondary | ICD-10-CM | POA: Diagnosis not present

## 2021-04-13 DIAGNOSIS — E785 Hyperlipidemia, unspecified: Secondary | ICD-10-CM | POA: Diagnosis not present

## 2021-04-13 DIAGNOSIS — F329 Major depressive disorder, single episode, unspecified: Secondary | ICD-10-CM | POA: Diagnosis not present

## 2021-04-13 DIAGNOSIS — Z79899 Other long term (current) drug therapy: Secondary | ICD-10-CM | POA: Diagnosis not present

## 2021-04-13 DIAGNOSIS — E559 Vitamin D deficiency, unspecified: Secondary | ICD-10-CM | POA: Diagnosis not present

## 2021-04-30 ENCOUNTER — Ambulatory Visit
Admission: EM | Admit: 2021-04-30 | Discharge: 2021-04-30 | Disposition: A | Discharge status: Home / Self Care | Payer: Medicaid Other | Attending: Urgent Care | Admitting: Urgent Care

## 2021-04-30 ENCOUNTER — Encounter: Payer: Self-pay | Admitting: Emergency Medicine

## 2021-04-30 DIAGNOSIS — J4520 Mild intermittent asthma, uncomplicated: Secondary | ICD-10-CM

## 2021-04-30 DIAGNOSIS — R053 Chronic cough: Secondary | ICD-10-CM | POA: Diagnosis not present

## 2021-04-30 DIAGNOSIS — B349 Viral infection, unspecified: Secondary | ICD-10-CM

## 2021-04-30 DIAGNOSIS — J3089 Other allergic rhinitis: Secondary | ICD-10-CM

## 2021-04-30 MED ORDER — PREDNISONE 20 MG PO TABS: ORAL_TABLET | ORAL | 0 refills | Status: DC

## 2021-04-30 MED ORDER — ALBUTEROL SULFATE HFA 108 (90 BASE) MCG/ACT IN AERS: 2.0000 | INHALATION_SPRAY | Freq: Four times a day (QID) | RESPIRATORY_TRACT | 0 refills | Status: AC | PRN

## 2021-04-30 MED ORDER — PROMETHAZINE-DM 6.25-15 MG/5ML PO SYRP: 5.0000 mL | ORAL_SOLUTION | Freq: Every evening | ORAL | 0 refills | Status: DC | PRN

## 2021-04-30 MED ORDER — BENZONATATE 100 MG PO CAPS: 100.0000 mg | ORAL_CAPSULE | Freq: Three times a day (TID) | ORAL | 0 refills | Status: DC | PRN

## 2021-04-30 NOTE — ED Triage Notes (Signed)
Pt here with URI since 11/24. Kids had the flu and then she got the same sx shortly after, but the sx have persisted.

## 2021-04-30 NOTE — ED Provider Notes (Signed)
Julia Lyons   MRN: [id number] DOB: [birth year]  Subjective:   Julia Lyons is a 30 y.o. female presenting for 10 to 14-day history of persistent sinus congestion, sinus pressure, bilateral ear fullness, persistent coughing.  No chest pain, shortness of breath or wheezing.  No oral pain, facial pain.  Patient's children had influenza and thinks she got it from them.  Has been sick since 19 April 2021.  Reports a history of asthma, states that it really flares up on her when she gets sick.  Would like a refill on her inhaler.  No current facility-administered medications for this encounter.  Current Outpatient Medications:    albuterol (PROVENTIL HFA;VENTOLIN HFA) 108 (90 Base) MCG/ACT inhaler, Inhale 2 puffs into the lungs every 6 (six) hours as needed for wheezing or shortness of breath., Disp: 1 Inhaler, Rfl: 0   ALPRAZolam (XANAX) 0.25 MG tablet, Take 0.25 mg by mouth daily as needed., Disp: , Rfl:    aspirin 81 MG chewable tablet, Baby Aspirin 81 mg chewable tablet  Chew 1 tablet every day by oral route., Disp: , Rfl:    atorvastatin (LIPITOR) 10 MG tablet, Take by mouth., Disp: , Rfl:    cetirizine (ZYRTEC) 10 MG tablet, Take 1 tablet (10 mg total) by mouth daily., Disp: 90 tablet, Rfl: 3   cholecalciferol (D-VI-SOL) 10 MCG/ML LIQD, Vitamin D, Disp: , Rfl:    CLOBEX 0.05 % shampoo, Shampoo into scalp let sit 10 minutes then wash out. Use 2 times per week., Disp: 118 mL, Rfl: 6   ketoconazole (NIZORAL) 2 % shampoo, Shampoo into scalp let sit 10 minutes then wash out. Use twice per week., Disp: 120 mL, Rfl: 6   mometasone (ELOCON) 0.1 % lotion, Apply topically daily. Apply to scalp once a day at bedtime for 2 weeks, then decrease to 5 nights a week, Disp: 60 mL, Rfl: 2   Omega-3 Fatty Acids (FISH OIL PO), Take by mouth., Disp: , Rfl:    Prenatal 28-0.8 MG TABS, Take 1 tablet by mouth daily., Disp: , Rfl:    Probiotic Product (PROBIOTIC DAILY PO), Take by mouth., Disp:  , Rfl:    Allergies  Allergen Reactions   Depo-Provera [Medroxyprogesterone] Other (See Comments)    Severe migraine and muscle spasms    Past Medical History:  Diagnosis Date   Allergy    Asthma    Chronic constipation    Cough 06/07/2008   Qualifier: Diagnosis of  By: Diona Browner MD, Amy     Hemorrhoid    History of chlamydia 06/2011   treated with doxy   Pregnancy 11/17/2017   Superficial thrombophlebitis 09/2015   right LE     Past Surgical History:  Procedure Laterality Date   FLEXIBLE SIGMOIDOSCOPY N/A 12/05/2020   Procedure: FLEXIBLE SIGMOIDOSCOPY;  Surgeon: Jonathon Bellows, MD;  Location: [facility] ENDOSCOPY;  Service: Gastroenterology;  Laterality: N/A;   MOUTH SURGERY     WISDOM TOOTH EXTRACTION      Family History  Problem Relation Age of Onset   Breast cancer Neg Hx    Colon cancer Neg Hx    Heart disease Neg Hx     Social History   Tobacco Use   Smoking status: Never   Smokeless tobacco: Never  Vaping Use   Vaping Use: Never used  Substance Use Topics   Alcohol use: Yes    Alcohol/week: 14.0 standard drinks    Types: 14 Glasses of wine per week   Drug use: No  ROS   Objective:   Vitals: BP 127/86   Pulse 78   Temp 98.1 F (36.7 C)   Resp 20   LMP 04/18/2021 (Approximate)   SpO2 98%   Breastfeeding No   Physical Exam Constitutional:      General: She is not in acute distress.    Appearance: Normal appearance. She is well-developed. She is not ill-appearing, toxic-appearing or diaphoretic.  HENT:     Head: Normocephalic and atraumatic.     Right Ear: Tympanic membrane, ear canal and external ear normal. No drainage or tenderness. No middle ear effusion. There is no impacted cerumen. Tympanic membrane is not erythematous.     Left Ear: Tympanic membrane, ear canal and external ear normal. No drainage or tenderness.  No middle ear effusion. There is no impacted cerumen. Tympanic membrane is not erythematous.     Nose: Congestion present. No  rhinorrhea.     Mouth/Throat:     Mouth: Mucous membranes are moist. No oral lesions.     Pharynx: No pharyngeal swelling, oropharyngeal exudate, posterior oropharyngeal erythema or uvula swelling.     Tonsils: No tonsillar exudate or tonsillar abscesses.  Eyes:     General: No scleral icterus.       Right eye: No discharge.        Left eye: No discharge.     Extraocular Movements: Extraocular movements intact.     Right eye: Normal extraocular motion.     Left eye: Normal extraocular motion.     Conjunctiva/sclera: Conjunctivae normal.     Pupils: Pupils are equal, round, and reactive to light.  Neck:     Meningeal: Brudzinski's sign and Kernig's sign absent.  Cardiovascular:     Rate and Rhythm: Normal rate and regular rhythm.     Pulses: Normal pulses.     Heart sounds: Normal heart sounds. No murmur heard.   No friction rub. No gallop.  Pulmonary:     Effort: Pulmonary effort is normal. No respiratory distress.     Breath sounds: Normal breath sounds. No stridor. No wheezing, rhonchi or rales.  Musculoskeletal:     Cervical back: Normal range of motion and neck supple. No rigidity.  Lymphadenopathy:     Cervical: No cervical adenopathy.  Skin:    General: Skin is warm and dry.     Findings: No rash.  Neurological:     General: No focal deficit present.     Mental Status: She is alert and oriented to person, place, and time.  Psychiatric:        Mood and Affect: Mood normal.        Behavior: Behavior normal.        Thought Content: Thought content normal.      Assessment and Plan :   PDMP not reviewed this encounter.  1. Persistent cough   2. Acute viral syndrome   3. Mild intermittent asthma without complication   4. Allergic rhinitis due to other allergic trigger, unspecified seasonality     Deferred imaging given clear cardiopulmonary exam, hemodynamically stable vital signs.  Offered her an oral prednisone course in the setting of her asthma, allergic  rhinitis.  No signs of an acute bacterial infectious process.  Recommend supportive care otherwise. Counseled patient on potential for adverse effects with medications prescribed/recommended today, ER and return-to-clinic precautions discussed, patient verbalized understanding.    Jaynee Eagles, PA-C 04/30/21 1049

## 2021-06-04 ENCOUNTER — Other Ambulatory Visit: Payer: Self-pay

## 2021-06-04 ENCOUNTER — Encounter: Payer: Self-pay | Admitting: Dermatology

## 2021-06-04 ENCOUNTER — Ambulatory Visit: Payer: Medicaid Other | Admitting: Dermatology

## 2021-06-04 DIAGNOSIS — D2239 Melanocytic nevi of other parts of face: Secondary | ICD-10-CM | POA: Diagnosis not present

## 2021-06-04 DIAGNOSIS — L821 Other seborrheic keratosis: Secondary | ICD-10-CM

## 2021-06-04 DIAGNOSIS — L409 Psoriasis, unspecified: Secondary | ICD-10-CM

## 2021-06-04 DIAGNOSIS — D229 Melanocytic nevi, unspecified: Secondary | ICD-10-CM

## 2021-06-04 MED ORDER — KETOCONAZOLE 2 % EX SHAM: MEDICATED_SHAMPOO | CUTANEOUS | 6 refills | Status: DC

## 2021-06-04 MED ORDER — MOMETASONE FUROATE 0.1 % EX SOLN: Freq: Every day | CUTANEOUS | 3 refills | Status: DC

## 2021-06-04 MED ORDER — CLOBEX 0.05 % EX SHAM: MEDICATED_SHAMPOO | CUTANEOUS | 6 refills | Status: DC

## 2021-06-04 NOTE — Patient Instructions (Signed)
Topical steroids (such as triamcinolone, fluocinolone, fluocinonide, mometasone, clobetasol, halobetasol, betamethasone, hydrocortisone) can cause thinning and lightening of the skin if they are used for too long in the same area. Your physician has selected the right strength medicine for your problem and area affected on the body. Please use your medication only as directed by your physician to prevent side effects.    If You Need Anything After Your Visit  If you have any questions or concerns for your doctor, please call our main line at [phone] and press option 4 to reach your doctor's medical assistant. If no one answers, please leave a voicemail as directed and we will return your call as soon as possible. Messages left after 4 pm will be answered the following business day.   You may also send [place] a message via [place]. We typically respond to MyChart messages within 1-2 business days.  For prescription refills, please ask your pharmacy to contact our office. Our fax number is [phone].  If you have an urgent issue when the clinic is closed that cannot wait until the next business day, you can page your doctor at the number below.    Please note that while we do our best to be available for urgent issues outside of office hours, we are not available 24/7.   If you have an urgent issue and are unable to reach [place], you may choose to seek medical care at your doctor's office, retail clinic, urgent care center, or emergency room.  If you have a medical emergency, please immediately call 911 or go to the emergency department.  Pager Numbers  - Dr. Nehemiah Massed: [phone]  - Dr. Laurence Ferrari: [phone]  - Dr. Nicole Kindred: [phone]  In the event of inclement weather, please call our main line at [phone] for an update on the status of any delays or closures.  Dermatology Medication Tips: Please keep the boxes that topical medications come in in order to help keep track of the  instructions about where and how to use these. Pharmacies typically print the medication instructions only on the boxes and not directly on the medication tubes.   If your medication is too expensive, please contact our office at [phone] option 4 or send [place] a message through [place].   We are unable to tell what your co-pay for medications will be in advance as this is different depending on your insurance coverage. However, we may be able to find a substitute medication at lower cost or fill out paperwork to get insurance to cover a needed medication.   If a prior authorization is required to get your medication covered by your insurance company, please allow [place] 1-2 business days to complete this process.  Drug prices often vary depending on where the prescription is filled and some pharmacies may offer cheaper prices.  The website www.goodrx.com contains coupons for medications through different pharmacies. The prices here do not account for what the cost may be with help from insurance (it may be cheaper with your insurance), but the website can give you the price if you did not use any insurance.  - You can print the associated coupon and take it with your prescription to the pharmacy.  - You may also stop by our office during regular business hours and pick up a GoodRx coupon card.  - If you need your prescription sent electronically to a different pharmacy, notify our office through [facility] or by phone at [phone] option 4.  Si Usted Necesita Algo Despus de Su Visita  Tambin puede enviarnos un mensaje a travs de Pharmacist, community. Por lo general respondemos a los mensajes de MyChart en el transcurso de 1 a 2 das hbiles.  Para renovar recetas, por favor pida a su farmacia que se ponga en contacto con nuestra oficina. Harland Dingwall de fax es [place] [phone].  Si tiene un asunto urgente cuando la clnica est cerrada y que no puede esperar hasta el siguiente da hbil,  puede llamar/localizar a su doctor(a) al nmero que aparece a continuacin.   Por favor, tenga en cuenta que aunque hacemos todo lo posible para estar disponibles para asuntos urgentes fuera del horario de [place], no estamos disponibles las 24 horas del da, los 7 das de la [place].   Si tiene un problema urgente y no puede comunicarse con nosotros, puede optar por buscar atencin mdica  en el consultorio de su doctor(a), en una clnica privada, en un centro de atencin urgente o en una sala de emergencias.  Si tiene Engineering geologist, por favor llame inmediatamente al 911 o vaya a la sala de emergencias.  Nmeros de bper  - Dr. Nehemiah Massed: [phone]  - Dra. Moye: [phone]  - Dra. Nicole Kindred: [phone]  En caso de inclemencias del [place], por favor llame a Johnsie Kindred principal al [phone] para una actualizacin sobre el [place] de cualquier retraso o cierre.  Consejos para la medicacin en dermatologa: Por favor, guarde las cajas en las que vienen los medicamentos de uso tpico para ayudarle a seguir las instrucciones sobre dnde y cmo usarlos. Las farmacias generalmente imprimen las instrucciones del medicamento slo en las cajas y no directamente en los tubos del [place].   Si su medicamento es muy caro, por favor, pngase en contacto con Zigmund Daniel llamando al [phone] y presione la opcin 4 o envenos un mensaje a travs de Pharmacist, community.   No podemos decirle cul ser su copago por los medicamentos por adelantado ya que esto es diferente dependiendo de la cobertura de su seguro. Sin embargo, es posible que podamos encontrar un medicamento sustituto a Electrical engineer un formulario para que el seguro cubra el medicamento que se considera necesario.   Si se requiere una autorizacin previa para que su compaa de seguros [place] su medicamento, por favor permtanos de 1 a 2 das hbiles para completar este proceso.  Los precios de los medicamentos varan con  frecuencia dependiendo del Environmental consultant de dnde se surte la receta y alguna farmacias pueden ofrecer precios ms baratos.  El sitio web www.goodrx.com tiene cupones para medicamentos de Airline pilot. Los precios aqu no tienen en cuenta lo que podra costar con la ayuda del seguro (puede ser ms barato con su seguro), pero el sitio web puede darle el precio si no utiliz Research scientist (physical sciences).  - Puede imprimir el cupn correspondiente y llevarlo con su receta a la farmacia.  - Tambin puede pasar por nuestra oficina durante el horario de atencin regular y Charity fundraiser una tarjeta de cupones de GoodRx.  - Si necesita que su receta se enve electrnicamente a una farmacia diferente, informe a nuestra oficina a travs de MyChart de [place] o por telfono llamando al [phone] y presione la opcin 4.

## 2021-06-04 NOTE — Progress Notes (Signed)
° °  Follow-Up Visit   Subjective  Julia Lyons is a 31 y.o. female who presents for the following: Follow-up (8 month recheck. Psoriasis. Scalp. Alternating salicylic acid shampoo and ketoconazole shampoo.Using clobetasol shampoo twice a week, and using mometasone solution up to 5 days a week as needed (has not had to use since this weather change). Patient reports condition is well controlled. ). She wonders about a mole on her left forehead and a spot on her arm.The patient has spots, moles and lesions to be evaluated, some may be new or changing and the patient has concerns that these could be cancer.  The following portions of the chart were reviewed this encounter and updated as appropriate:  Tobacco   Allergies   Meds   Problems   Med Hx   Surg Hx   Fam Hx      Review of Systems: No other skin or systemic complaints except as noted in HPI or Assessment and Plan.  Objective  Well appearing patient in no apparent distress; mood and affect are within normal limits.  A focused examination was performed including head, including the scalp, face, neck, nose, ears, eyelids, and lips. Relevant physical exam findings are noted in the Assessment and Plan.  Scalp Mild scale, mild erythema.   Right Forearm - Posterior Stuck-on, waxy, tan-brown papules -- Discussed benign etiology and prognosis.   Left Forehead 0.3 cm flesh-colored symmetric papule      Assessment & Plan  Psoriasis -much improved on current regimen. Scalp  Continue alternating salicylic acid shampoo and ketoconazole shampoo. Using clobetasol shampoo twice a week, and using  mometasone solution up to 5 days a week as needed for flares.   Psoriasis is a chronic non-curable, but treatable genetic/hereditary disease that may have other systemic features affecting other organ systems such as joints (Psoriatic Arthritis). It is associated with an increased risk of inflammatory bowel disease, heart disease, non-alcoholic  fatty liver disease, and depression.    Topical steroids (such as triamcinolone, fluocinolone, fluocinonide, mometasone, clobetasol, halobetasol, betamethasone, hydrocortisone) can cause thinning and lightening of the skin if they are used for too long in the same area. Your physician has selected the right strength medicine for your problem and area affected on the body. Please use your medication only as directed by your physician to prevent side effects.   Related Medications mometasone (ELOCON) 0.1 % lotion Apply topically daily. Apply to scalp once a day at bedtime for 2 weeks, then decrease to 5 nights a week PRN flares ketoconazole (NIZORAL) 2 % shampoo Shampoo into scalp let sit 10 minutes then wash out. Use twice per week. CLOBEX 0.05 % shampoo Shampoo into scalp let sit 10 minutes then wash out. Use 2 times per week.  Seborrheic keratosis Right Forearm - Posterior Reassured benign age-related growth.  Recommend observation.  Discussed cryotherapy if spot(s) become irritated or inflamed.  Nevus Left Forehead See photo Benign-appearing.  Observation.  Call clinic for new or changing lesions.  Recommend daily use of broad spectrum spf 30+ sunscreen to sun-exposed areas.   Return in 1 year (on 06/04/2022) for Psoriasis Follow Up.  I, Emelia Salisbury, CMA, am acting as scribe for Sarina Ser, MD. Documentation: I have reviewed the above documentation for accuracy and completeness, and I agree with the above.  Sarina Ser, MD

## 2021-06-05 ENCOUNTER — Encounter: Payer: Self-pay | Admitting: Dermatology

## 2021-06-29 DIAGNOSIS — M545 Low back pain, unspecified: Secondary | ICD-10-CM | POA: Diagnosis not present

## 2021-07-10 DIAGNOSIS — M545 Low back pain, unspecified: Secondary | ICD-10-CM | POA: Diagnosis not present

## 2021-07-23 DIAGNOSIS — F329 Major depressive disorder, single episode, unspecified: Secondary | ICD-10-CM | POA: Diagnosis not present

## 2021-07-23 DIAGNOSIS — E559 Vitamin D deficiency, unspecified: Secondary | ICD-10-CM | POA: Diagnosis not present

## 2021-07-23 DIAGNOSIS — E7800 Pure hypercholesterolemia, unspecified: Secondary | ICD-10-CM | POA: Diagnosis not present

## 2021-07-23 DIAGNOSIS — E785 Hyperlipidemia, unspecified: Secondary | ICD-10-CM | POA: Diagnosis not present

## 2021-07-23 DIAGNOSIS — Z79899 Other long term (current) drug therapy: Secondary | ICD-10-CM | POA: Diagnosis not present

## 2021-07-23 DIAGNOSIS — N926 Irregular menstruation, unspecified: Secondary | ICD-10-CM | POA: Diagnosis not present

## 2021-07-23 DIAGNOSIS — F419 Anxiety disorder, unspecified: Secondary | ICD-10-CM | POA: Diagnosis not present

## 2021-07-23 DIAGNOSIS — E039 Hypothyroidism, unspecified: Secondary | ICD-10-CM | POA: Diagnosis not present

## 2021-07-31 DIAGNOSIS — F419 Anxiety disorder, unspecified: Secondary | ICD-10-CM | POA: Diagnosis not present

## 2021-07-31 DIAGNOSIS — E785 Hyperlipidemia, unspecified: Secondary | ICD-10-CM | POA: Diagnosis not present

## 2021-07-31 DIAGNOSIS — L659 Nonscarring hair loss, unspecified: Secondary | ICD-10-CM | POA: Diagnosis not present

## 2021-07-31 DIAGNOSIS — R5383 Other fatigue: Secondary | ICD-10-CM | POA: Diagnosis not present

## 2021-07-31 DIAGNOSIS — R221 Localized swelling, mass and lump, neck: Secondary | ICD-10-CM | POA: Diagnosis not present

## 2021-08-10 DIAGNOSIS — M545 Low back pain, unspecified: Secondary | ICD-10-CM | POA: Diagnosis not present

## 2021-08-17 DIAGNOSIS — Z0000 Encounter for general adult medical examination without abnormal findings: Secondary | ICD-10-CM | POA: Diagnosis not present

## 2021-09-11 DIAGNOSIS — N390 Urinary tract infection, site not specified: Secondary | ICD-10-CM | POA: Diagnosis not present

## 2021-09-11 DIAGNOSIS — R319 Hematuria, unspecified: Secondary | ICD-10-CM | POA: Diagnosis not present

## 2021-09-11 DIAGNOSIS — R3915 Urgency of urination: Secondary | ICD-10-CM | POA: Diagnosis not present

## 2021-10-05 DIAGNOSIS — I8391 Asymptomatic varicose veins of right lower extremity: Secondary | ICD-10-CM | POA: Diagnosis not present

## 2021-10-05 DIAGNOSIS — N760 Acute vaginitis: Secondary | ICD-10-CM | POA: Diagnosis not present

## 2021-10-05 DIAGNOSIS — J45909 Unspecified asthma, uncomplicated: Secondary | ICD-10-CM | POA: Diagnosis not present

## 2021-10-05 DIAGNOSIS — N390 Urinary tract infection, site not specified: Secondary | ICD-10-CM | POA: Diagnosis not present

## 2021-10-07 IMAGING — DX DG CHEST 1V PORT
1 series · 1 of 1 positions shown · non-contrast
Comparison: June 29, 2014.

CLINICAL DATA: Cough.

EXAM:
PORTABLE CHEST 1 VIEW

[chest ap]
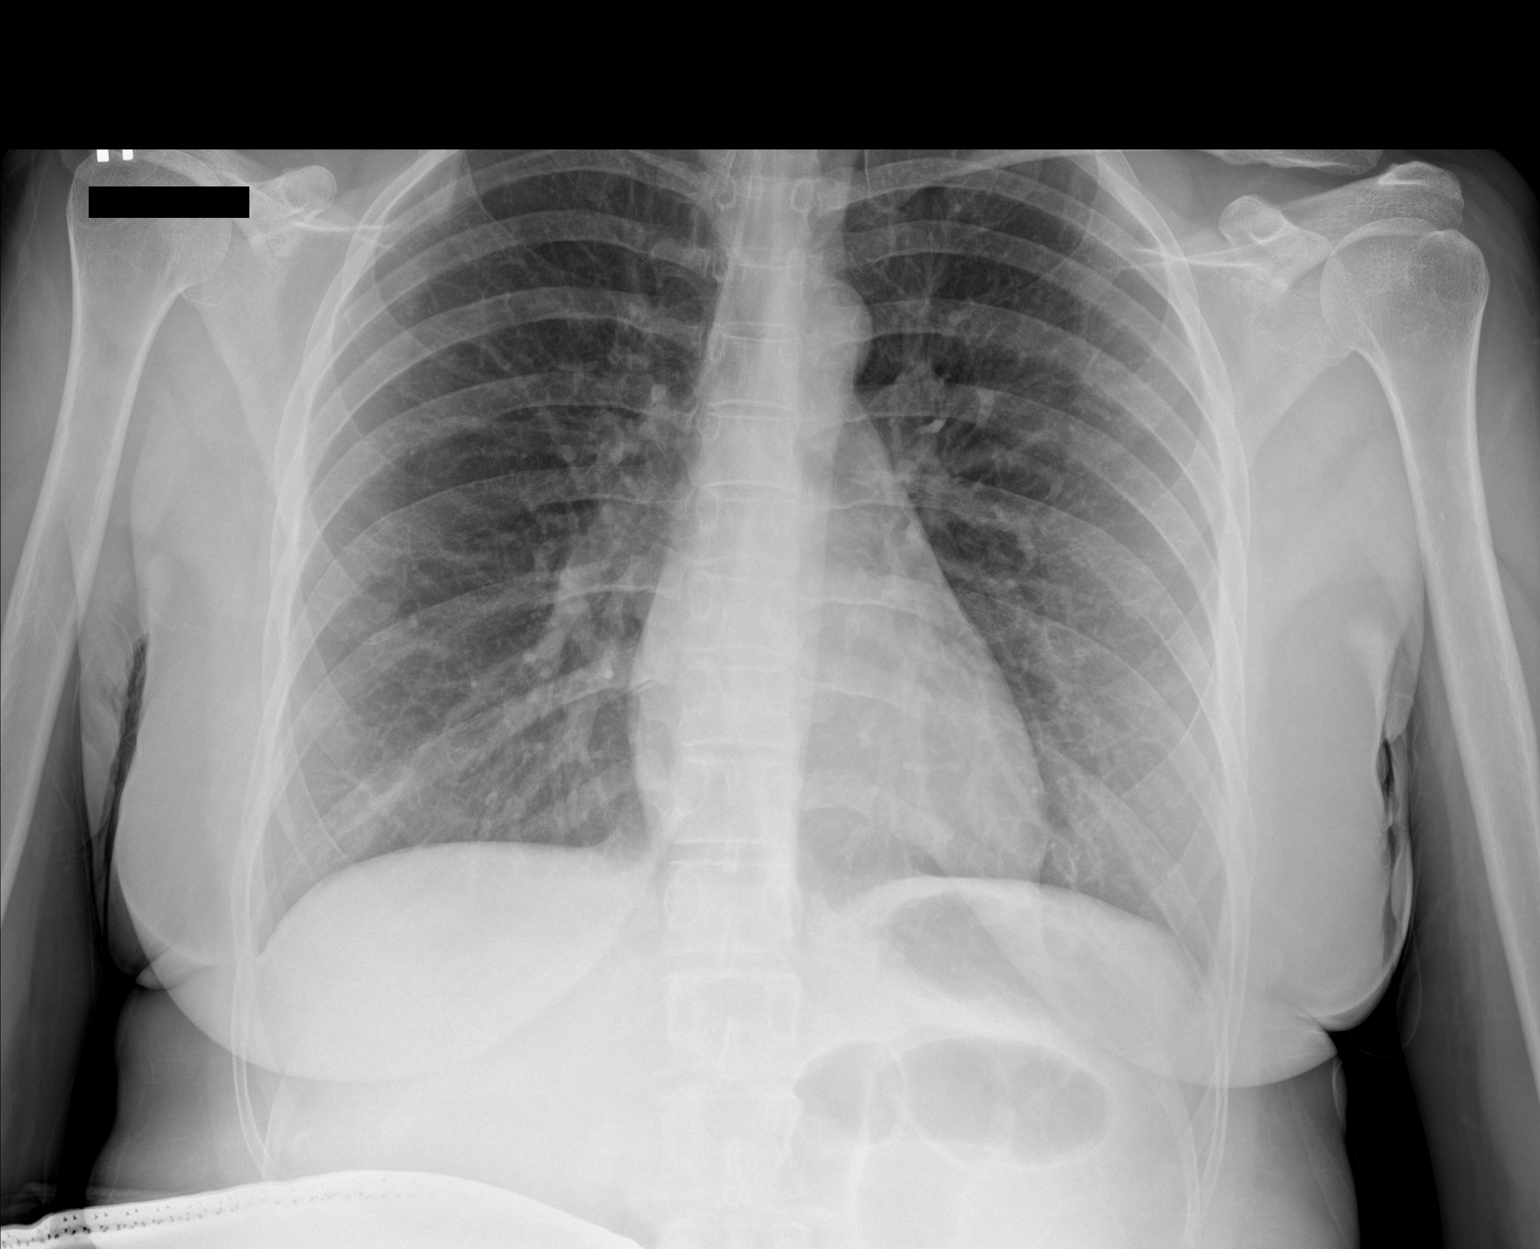

[1 of 1 positions shown; findings below may reference images not displayed]

FINDINGS: The heart size and mediastinal contours are within normal limits.
Both lungs are clear. The visualized skeletal structures are
unremarkable.
IMPRESSION: No active disease.

## 2021-10-15 DIAGNOSIS — R1013 Epigastric pain: Secondary | ICD-10-CM | POA: Diagnosis not present

## 2021-10-15 DIAGNOSIS — K5909 Other constipation: Secondary | ICD-10-CM | POA: Diagnosis not present

## 2021-10-15 DIAGNOSIS — R140 Abdominal distension (gaseous): Secondary | ICD-10-CM | POA: Diagnosis not present

## 2021-10-15 DIAGNOSIS — R7989 Other specified abnormal findings of blood chemistry: Secondary | ICD-10-CM | POA: Diagnosis not present

## 2021-10-19 DIAGNOSIS — E559 Vitamin D deficiency, unspecified: Secondary | ICD-10-CM | POA: Diagnosis not present

## 2021-10-19 DIAGNOSIS — Z79899 Other long term (current) drug therapy: Secondary | ICD-10-CM | POA: Diagnosis not present

## 2021-10-19 DIAGNOSIS — E7800 Pure hypercholesterolemia, unspecified: Secondary | ICD-10-CM | POA: Diagnosis not present

## 2021-10-19 DIAGNOSIS — E785 Hyperlipidemia, unspecified: Secondary | ICD-10-CM | POA: Diagnosis not present

## 2021-10-19 DIAGNOSIS — F419 Anxiety disorder, unspecified: Secondary | ICD-10-CM | POA: Diagnosis not present

## 2021-10-19 DIAGNOSIS — I8391 Asymptomatic varicose veins of right lower extremity: Secondary | ICD-10-CM | POA: Diagnosis not present

## 2021-10-19 DIAGNOSIS — E039 Hypothyroidism, unspecified: Secondary | ICD-10-CM | POA: Diagnosis not present

## 2021-11-23 DIAGNOSIS — K449 Diaphragmatic hernia without obstruction or gangrene: Secondary | ICD-10-CM | POA: Diagnosis not present

## 2021-11-23 DIAGNOSIS — K2970 Gastritis, unspecified, without bleeding: Secondary | ICD-10-CM | POA: Diagnosis not present

## 2021-11-23 DIAGNOSIS — K317 Polyp of stomach and duodenum: Secondary | ICD-10-CM | POA: Diagnosis not present

## 2021-12-05 DIAGNOSIS — I8310 Varicose veins of unspecified lower extremity with inflammation: Secondary | ICD-10-CM | POA: Insufficient documentation

## 2021-12-05 NOTE — Progress Notes (Signed)
MRN : [id number]  Julia Lyons is a 31 y.o. ([birth year]) female who presents with chief complaint of varicose veins hurt.  History of Present Illness:  The patient is seen for evaluation of symptomatic varicose veins. The patient relates burning and stinging which worsened steadily throughout the course of the day, particularly with standing. The patient also notes an aching and throbbing pain over the varicosities, particularly with prolonged dependent positions. The symptoms are significantly improved with elevation.  The patient also notes that during hot weather the symptoms are greatly intensified. The patient states the pain from the varicose veins interferes with work, daily exercise, shopping and household maintenance. At this point, the symptoms are persistent and severe enough that they're having a negative impact on lifestyle and are interfering with daily activities.  She has experienced superficial phlebitis of these veins in the recent past.  There is no history of DVT, PE   There is no history of ulceration or hemorrhage. The patient denies a significant family history of varicose veins.  The patient has not worn graduated compression in the past. At the present time the patient has not been using over-the-counter analgesics. There is no history of prior surgical intervention or sclerotherapy.    No outpatient medications have been marked as taking for the 12/06/21 encounter (Appointment) with Delana Meyer, Dolores Lory, MD.    Past Medical History:  Diagnosis Date   Allergy    Asthma    Chronic constipation    Cough 06/07/2008   Qualifier: Diagnosis of  By: Diona Browner MD, Amy     Hemorrhoid    History of chlamydia 06/2011   treated with doxy   Pregnancy 11/17/2017   Superficial thrombophlebitis 09/2015   right LE    Past Surgical History:  Procedure Laterality Date   FLEXIBLE SIGMOIDOSCOPY N/A 12/05/2020   Procedure: FLEXIBLE SIGMOIDOSCOPY;  Surgeon: Jonathon Bellows,  MD;  Location: [facility] ENDOSCOPY;  Service: Gastroenterology;  Laterality: N/A;   MOUTH SURGERY     WISDOM TOOTH EXTRACTION      Social History Social History   Tobacco Use   Smoking status: Never   Smokeless tobacco: Never  Vaping Use   Vaping Use: Never used  Substance Use Topics   Alcohol use: Yes    Alcohol/week: 14.0 standard drinks of alcohol    Types: 14 Glasses of wine per week   Drug use: No    Family History Family History  Problem Relation Age of Onset   Breast cancer Neg Hx    Colon cancer Neg Hx    Heart disease Neg Hx     Allergies  Allergen Reactions   Depo-Provera [Medroxyprogesterone] Other (See Comments)    Severe migraine and muscle spasms     REVIEW OF SYSTEMS (Negative unless checked)  Constitutional: '[]'$ Weight loss  '[]'$ Fever  '[]'$ Chills Cardiac: '[]'$ Chest pain   '[]'$ Chest pressure   '[]'$ Palpitations   '[]'$ Shortness of breath when laying flat   '[]'$ Shortness of breath with exertion. Vascular:  '[]'$ Pain in legs with walking   '[x]'$ Pain in legs with standing  '[]'$ History of DVT   '[]'$ Phlebitis   '[]'$ Swelling in legs   '[x]'$ Varicose veins   '[]'$ Non-healing ulcers Pulmonary:   '[]'$ Uses home oxygen   '[]'$ Productive cough   '[]'$ Hemoptysis   '[]'$ Wheeze  '[]'$ COPD   '[]'$ Asthma Neurologic:  '[]'$ Dizziness   '[]'$ Seizures   '[]'$ History of stroke   '[]'$ History of TIA  '[]'$ Aphasia   '[]'$ Vissual changes   '[]'$ Weakness or numbness in arm   '[]'$ Weakness or  numbness in leg Musculoskeletal:   '[]'$ Joint swelling   '[]'$ Joint pain   '[]'$ Low back pain Hematologic:  '[]'$ Easy bruising  '[]'$ Easy bleeding   '[]'$ Hypercoagulable state   '[]'$ Anemic Gastrointestinal:  '[]'$ Diarrhea   '[]'$ Vomiting  '[]'$ Gastroesophageal reflux/heartburn   '[]'$ Difficulty swallowing. Genitourinary:  '[]'$ Chronic kidney disease   '[]'$ Difficult urination  '[]'$ Frequent urination   '[]'$ Blood in urine Skin:  '[]'$ Rashes   '[]'$ Ulcers  Psychological:  '[]'$ History of anxiety   '[]'$  History of major depression.  Physical Examination  There were no vitals filed for this visit. There is no height or  weight on file to calculate BMI. Gen: WD/WN, NAD Head: [place]/AT, No temporalis wasting.  Ear/Nose/Throat: Hearing grossly intact, nares w/o erythema or drainage, pinna without lesions Eyes: PER, EOMI, sclera nonicteric.  Neck: Supple, no gross masses.  No JVD.  Pulmonary:  Good air movement, no audible wheezing, no use of accessory muscles.  Cardiac: RRR, precordium not hyperdynamic. Vascular:  Large varicosities present, greater than 10 mm right leg.  Veins are tender to palpation  Mild venous changes to the legs bilaterally.  Trace soft pitting edema  Vessel Right Left  Radial Palpable Palpable  Gastrointestinal: soft, non-distended. No guarding/no peritoneal signs.  Musculoskeletal: M/S 5/5 throughout.  No deformity.  Neurologic: CN 2-12 intact. Pain and light touch intact in extremities.  Symmetrical.  Speech is fluent. Motor exam as listed above. Psychiatric: Judgment intact, Mood & affect appropriate for pt's clinical situation. Dermatologic: Venous rashes no ulcers noted.  No changes consistent with cellulitis. Lymph : No lichenification or skin changes of chronic lymphedema.  CBC Lab Results  Component Value Date   WBC 7.9 11/17/2020   HGB 13.4 11/17/2020   HCT 42.0 11/17/2020   MCV 89 11/17/2020   PLT 279 11/17/2020    BMET    Component Value Date/Time   NA 138 03/11/2018 1329   K 4.0 03/11/2018 1329   CL 103 03/11/2018 1329   CO2 29 03/11/2018 1329   GLUCOSE 76 03/11/2018 1329   BUN 16 03/11/2018 1329   CREATININE 0.82 03/11/2018 1329   CALCIUM 9.8 03/11/2018 1329   CrCl cannot be calculated (Patient's most recent lab result is older than the maximum 21 days allowed.).  COAG No results found for: "INR", "PROTIME"  Radiology No results found.   Assessment/Plan 1. Varicose veins with inflammation  Recommend:  The patient has large symptomatic varicose veins that are painful and associated with swelling.  I have had a long discussion with the patient  regarding  varicose veins and why they cause symptoms.  Patient will begin wearing graduated compression stockings class 1 on a daily basis, beginning first thing in the morning and removing them in the evening. The patient is instructed specifically not to sleep in the stockings.    The patient  will also begin using over-the-counter analgesics such as Motrin 600 mg po TID to help control the symptoms.    In addition, behavioral modification including elevation during the day will be initiated.    Pending the results of these changes the  patient will be reevaluated in three months.   An ultrasound of the venous system will be obtained.   Further plans will be based on the ultrasound results and whether conservative therapies are successful at eliminating the pain and swelling.   - VAS [place] LOWER EXTREMITY VENOUS REFLUX; Future  2. Exercise-induced asthma Continue pulmonary medications and aerosols as already ordered, these medications have been reviewed and there are no changes at this time.  Hortencia Pilar, MD  12/05/2021 4:51 PM

## 2021-12-06 ENCOUNTER — Ambulatory Visit (INDEPENDENT_AMBULATORY_CARE_PROVIDER_SITE_OTHER): Payer: Medicaid Other | Admitting: Vascular Surgery

## 2021-12-06 ENCOUNTER — Encounter (INDEPENDENT_AMBULATORY_CARE_PROVIDER_SITE_OTHER): Payer: Self-pay | Admitting: Vascular Surgery

## 2021-12-06 VITALS — BP 122/75 | HR 68 | Resp 16 | Wt 131.2 lb

## 2021-12-06 DIAGNOSIS — I83819 Varicose veins of unspecified lower extremities with pain: Secondary | ICD-10-CM | POA: Diagnosis not present

## 2021-12-06 DIAGNOSIS — J45990 Exercise induced bronchospasm: Secondary | ICD-10-CM | POA: Diagnosis not present

## 2021-12-06 DIAGNOSIS — I8310 Varicose veins of unspecified lower extremity with inflammation: Secondary | ICD-10-CM

## 2021-12-07 ENCOUNTER — Encounter (INDEPENDENT_AMBULATORY_CARE_PROVIDER_SITE_OTHER): Payer: Self-pay | Admitting: Vascular Surgery

## 2022-01-18 DIAGNOSIS — E559 Vitamin D deficiency, unspecified: Secondary | ICD-10-CM | POA: Diagnosis not present

## 2022-01-18 DIAGNOSIS — F329 Major depressive disorder, single episode, unspecified: Secondary | ICD-10-CM | POA: Diagnosis not present

## 2022-01-18 DIAGNOSIS — Z79899 Other long term (current) drug therapy: Secondary | ICD-10-CM | POA: Diagnosis not present

## 2022-01-18 DIAGNOSIS — E785 Hyperlipidemia, unspecified: Secondary | ICD-10-CM | POA: Diagnosis not present

## 2022-01-18 DIAGNOSIS — F419 Anxiety disorder, unspecified: Secondary | ICD-10-CM | POA: Diagnosis not present

## 2022-03-04 ENCOUNTER — Encounter (INDEPENDENT_AMBULATORY_CARE_PROVIDER_SITE_OTHER): Payer: Self-pay | Admitting: Vascular Surgery

## 2022-03-04 ENCOUNTER — Ambulatory Visit (INDEPENDENT_AMBULATORY_CARE_PROVIDER_SITE_OTHER): Payer: Medicaid Other | Admitting: Vascular Surgery

## 2022-03-04 ENCOUNTER — Ambulatory Visit (INDEPENDENT_AMBULATORY_CARE_PROVIDER_SITE_OTHER): Payer: Medicaid Other

## 2022-03-04 VITALS — BP 109/75 | HR 76 | Resp 17 | Ht 61.0 in | Wt 133.8 lb

## 2022-03-04 DIAGNOSIS — I8310 Varicose veins of unspecified lower extremity with inflammation: Secondary | ICD-10-CM | POA: Diagnosis not present

## 2022-03-04 DIAGNOSIS — J45990 Exercise induced bronchospasm: Secondary | ICD-10-CM

## 2022-03-04 DIAGNOSIS — I8311 Varicose veins of right lower extremity with inflammation: Secondary | ICD-10-CM | POA: Diagnosis not present

## 2022-03-09 ENCOUNTER — Encounter (INDEPENDENT_AMBULATORY_CARE_PROVIDER_SITE_OTHER): Payer: Self-pay | Admitting: Vascular Surgery

## 2022-03-09 NOTE — Progress Notes (Signed)
MRN : [id number]  Julia Lyons is a 31 y.o. (1990-11-28) female who presents with chief complaint of varicose veins hurt.  History of Present Illness:  The patient returns for followup evaluation 3 months after the initial visit. The patient continues to have pain in the lower extremities with dependency. The pain is lessened with elevation. Graduated compression stockings, Class I (20-30 mmHg), have been worn but the stockings do not eliminate the leg pain. Over-the-counter analgesics do not improve the symptoms. The degree of discomfort continues to interfere with daily activities. The patient notes the pain in the legs is causing problems with daily exercise, at the workplace and even with household activities and maintenance such as standing in the kitchen preparing meals and doing dishes.   Venous ultrasound shows normal deep venous system, no evidence of acute or chronic DVT.  Superficial reflux is present in the right great saphenous vein    Current Meds  Medication Sig   albuterol (VENTOLIN HFA) 108 (90 Base) MCG/ACT inhaler Inhale 2 puffs into the lungs every 6 (six) hours as needed for wheezing or shortness of breath.   ALPRAZolam (XANAX) 0.25 MG tablet Take 0.25 mg by mouth daily as needed.   atorvastatin (LIPITOR) 10 MG tablet Take by mouth.   cetirizine (ZYRTEC) 10 MG tablet Take 1 tablet (10 mg total) by mouth daily.   cholecalciferol (D-VI-SOL) 10 MCG/ML LIQD Vitamin D   mometasone (ELOCON) 0.1 % lotion Apply topically daily. Apply to scalp once a day at bedtime for 2 weeks, then decrease to 5 nights a week PRN flares   Omega-3 Fatty Acids (FISH OIL PO) Take by mouth.   Probiotic Product (PROBIOTIC DAILY PO) Take by mouth.    Past Medical History:  Diagnosis Date   Allergy    Asthma    Chronic constipation    Cough 06/07/2008   Qualifier: Diagnosis of  By: Diona Browner MD, Amy     Hemorrhoid    History of chlamydia 06/2011   treated with doxy   Pregnancy  11/17/2017   Superficial thrombophlebitis 09/2015   right LE    Past Surgical History:  Procedure Laterality Date   FLEXIBLE SIGMOIDOSCOPY N/A 12/05/2020   Procedure: FLEXIBLE SIGMOIDOSCOPY;  Surgeon: Jonathon Bellows, MD;  Location: [facility] ENDOSCOPY;  Service: Gastroenterology;  Laterality: N/A;   MOUTH SURGERY     WISDOM TOOTH EXTRACTION      Social History Social History   Tobacco Use   Smoking status: Never   Smokeless tobacco: Never  Vaping Use   Vaping Use: Never used  Substance Use Topics   Alcohol use: Yes    Alcohol/week: 14.0 standard drinks of alcohol    Types: 14 Glasses of wine per week   Drug use: No    Family History Family History  Problem Relation Age of Onset   Hyperlipidemia Father    Breast cancer Neg Hx    Colon cancer Neg Hx    Heart disease Neg Hx     Allergies  Allergen Reactions   Depo-Provera [Medroxyprogesterone] Other (See Comments)    Severe migraine and muscle spasms     REVIEW OF SYSTEMS (Negative unless checked)  Constitutional: '[]'$ Weight loss  '[]'$ Fever  '[]'$ Chills Cardiac: '[]'$ Chest pain   '[]'$ Chest pressure   '[]'$ Palpitations   '[]'$ Shortness of breath when laying flat   '[]'$ Shortness of breath with exertion. Vascular:  '[]'$ Pain in legs with walking   '[x]'$ Pain in legs with standing  '[]'$ History of DVT   '[]'$ Phlebitis   '[]'$   Swelling in legs   '[x]'$ Varicose veins   '[]'$ Non-healing ulcers Pulmonary:   '[]'$ Uses home oxygen   '[]'$ Productive cough   '[]'$ Hemoptysis   '[]'$ Wheeze  '[]'$ COPD   '[]'$ Asthma Neurologic:  '[]'$ Dizziness   '[]'$ Seizures   '[]'$ History of stroke   '[]'$ History of TIA  '[]'$ Aphasia   '[]'$ Vissual changes   '[]'$ Weakness or numbness in arm   '[]'$ Weakness or numbness in leg Musculoskeletal:   '[]'$ Joint swelling   '[]'$ Joint pain   '[]'$ Low back pain Hematologic:  '[]'$ Easy bruising  '[]'$ Easy bleeding   '[]'$ Hypercoagulable state   '[]'$ Anemic Gastrointestinal:  '[]'$ Diarrhea   '[]'$ Vomiting  '[]'$ Gastroesophageal reflux/heartburn   '[]'$ Difficulty swallowing. Genitourinary:  '[]'$ Chronic kidney disease   '[]'$ Difficult  urination  '[]'$ Frequent urination   '[]'$ Blood in urine Skin:  '[]'$ Rashes   '[]'$ Ulcers  Psychological:  '[]'$ History of anxiety   '[]'$  History of major depression.  Physical Examination  Vitals:   03/04/22 1015  BP: 109/75  Pulse: 76  Resp: 17  Weight: 133 lb 12.8 oz (60.7 kg)  Height: '5\' 1"'$  (1.549 m)   Body mass index is 25.28 kg/m. Gen: WD/WN, NAD Head: [place]/AT, No temporalis wasting.  Ear/Nose/Throat: Hearing grossly intact, nares w/o erythema or drainage, pinna without lesions Eyes: PER, EOMI, sclera nonicteric.  Neck: Supple, no gross masses.  No JVD.  Pulmonary:  Good air movement, no audible wheezing, no use of accessory muscles.  Cardiac: RRR, precordium not hyperdynamic. Vascular:  Large varicosities present, greater than 10 mm right.  Veins are tender to palpation  Moderate venous stasis changes to the legs bilaterally.  Trace soft pitting edema. C3sEpAsPr  Vessel Right Left  Radial Palpable Palpable  Gastrointestinal: soft, non-distended. No guarding/no peritoneal signs.  Musculoskeletal: M/S 5/5 throughout.  No deformity.  Neurologic: CN 2-12 intact. Pain and light touch intact in extremities.  Symmetrical.  Speech is fluent. Motor exam as listed above. Psychiatric: Judgment intact, Mood & affect appropriate for pt's clinical situation. Dermatologic: Venous rashes no ulcers noted.  No changes consistent with cellulitis. Lymph : No lichenification or skin changes of chronic lymphedema.  CBC Lab Results  Component Value Date   WBC 7.9 11/17/2020   HGB 13.4 11/17/2020   HCT 42.0 11/17/2020   MCV 89 11/17/2020   PLT 279 11/17/2020    BMET    Component Value Date/Time   NA 138 03/11/2018 1329   K 4.0 03/11/2018 1329   CL 103 03/11/2018 1329   CO2 29 03/11/2018 1329   GLUCOSE 76 03/11/2018 1329   BUN 16 03/11/2018 1329   CREATININE 0.82 03/11/2018 1329   CALCIUM 9.8 03/11/2018 1329   CrCl cannot be calculated (Patient's most recent lab result is older than the maximum 21  days allowed.).  COAG No results found for: "INR", "PROTIME"  Radiology No results found.   Assessment/Plan 1. Varicose veins with inflammation Recommend  I have reviewed my previous  discussion with the patient regarding  varicose veins and why they cause symptoms. Patient will continue  wearing graduated compression stockings class 1 on a daily basis, beginning first thing in the morning and removing them in the evening.  She is C3sEpAsPr.  In addition, behavioral modification including elevation during the day was again discussed and this will continue.  The patient has utilized over the counter pain medications and has been exercising.  However, at this time conservative therapy has not alleviated the patient's symptoms of leg pain and swelling  Recommend: laser ablation of the right great saphenous vein to eliminate the symptoms of pain  and swelling of the lower extremities caused by the severe superficial venous reflux disease.    2. Exercise-induced asthma Continue pulmonary medications and aerosols as already ordered, these medications have been reviewed and there are no changes at this time.      Hortencia Pilar, MD  03/09/2022 12:09 PM

## 2022-03-25 ENCOUNTER — Encounter (INDEPENDENT_AMBULATORY_CARE_PROVIDER_SITE_OTHER): Payer: Self-pay

## 2022-04-06 ENCOUNTER — Telehealth: Payer: Medicaid Other | Admitting: Physician Assistant

## 2022-04-06 ENCOUNTER — Encounter: Payer: Self-pay | Admitting: Physician Assistant

## 2022-04-06 DIAGNOSIS — N760 Acute vaginitis: Secondary | ICD-10-CM

## 2022-04-06 MED ORDER — FLUCONAZOLE 150 MG PO TABS: 150.0000 mg | ORAL_TABLET | Freq: Once | ORAL | 0 refills | Status: AC

## 2022-04-06 NOTE — Progress Notes (Signed)
Virtual Visit Consent   Julia Lyons, you are scheduled for a virtual visit with a [facility] provider today. Just as with appointments in the office, your consent must be obtained to participate. Your consent will be active for this visit and any virtual visit you may have with one of our providers in the next 365 days. If you have a MyChart account, a copy of this consent can be sent to you electronically.  As this is a virtual visit, video technology does not allow for your provider to perform a traditional examination. This may limit your provider's ability to fully assess your condition. If your provider identifies any concerns that need to be evaluated in person or the need to arrange testing (such as labs, EKG, etc.), we will make arrangements to do so. Although advances in technology are sophisticated, we cannot ensure that it will always work on either your end or our end. If the connection with a video visit is poor, the visit may have to be switched to a telephone visit. With either a video or telephone visit, we are not always able to ensure that we have a secure connection.  By engaging in this virtual visit, you consent to the provision of healthcare and authorize for your insurance to be billed (if applicable) for the services provided during this visit. Depending on your insurance coverage, you may receive a charge related to this service.  I need to obtain your verbal consent now. Are you willing to proceed with your visit today? Julia Lyons has provided verbal consent on 04/06/2022 for a virtual visit (video or telephone). Inda Coke, [place]  Date: 04/06/2022 10:23 AM  Virtual Visit via Video Note   I, Inda Coke, connected with  Julia Lyons  ([id number], [birth year]) on 04/06/22 at 10:45 AM EST by a video-enabled telemedicine application and verified that I am speaking with the correct person using two identifiers.  Location: Patient: Virtual Visit  Location Patient: Home Provider: Virtual Visit Location Provider: Home Office   I discussed the limitations of evaluation and management by telemedicine and the availability of in person appointments. The patient expressed understanding and agreed to proceed.    History of Present Illness: Julia Lyons is a 31 y.o. who identifies as a female who was assigned female at birth, and is being seen today for vaginal discharge.  Patient reports an episode of increased sexual activity with her partner which resulted in vaginal irritation and itching the next day. She has also developed thick, white, clumpy discharge. She denies fevers, chills, vaginal bleeding, concerns for pregnancy or STDs. She has not tried anything for her symptoms. They have not gotten progressively worse. She has tried monistat with mild relief.  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   Blood type O+ 07/21/2017   Seasonal allergic rhinitis due to pollen 09/27/2016   Exercise-induced asthma 10/22/2011    Allergies:  Allergies  Allergen Reactions   Depo-Provera [Medroxyprogesterone] Other (See Comments)    Severe migraine and muscle spasms   Medications:  Current Outpatient Medications:    albuterol (VENTOLIN HFA) 108 (90 Base) MCG/ACT inhaler, Inhale 2 puffs into the lungs every 6 (six) hours as needed for wheezing or shortness of breath., Disp: 18 g, Rfl: 0   ALPRAZolam (XANAX) 0.25 MG tablet, Take 0.25 mg by mouth daily as needed., Disp: , Rfl:  atorvastatin (LIPITOR) 10 MG tablet, Take by mouth., Disp: , Rfl:    cetirizine (ZYRTEC) 10 MG tablet, Take 1 tablet (10 mg total) by mouth daily., Disp: 90 tablet, Rfl: 3   cholecalciferol (D-VI-SOL) 10 MCG/ML LIQD, Vitamin D, Disp: , Rfl:    fluconazole (DIFLUCAN) 150 MG tablet, Take 1 tablet (150 mg total) by mouth once for 1 dose., Disp: 1  tablet, Rfl: 0   mometasone (ELOCON) 0.1 % lotion, Apply topically daily. Apply to scalp once a day at bedtime for 2 weeks, then decrease to 5 nights a week PRN flares, Disp: 60 mL, Rfl: 3   Omega-3 Fatty Acids (FISH OIL PO), Take by mouth., Disp: , Rfl:    Probiotic Product (PROBIOTIC DAILY PO), Take by mouth., Disp: , Rfl:   Observations/Objective: Patient is well-developed, well-nourished in no acute distress.  Resting comfortably  at home.  Head is normocephalic, atraumatic.  No labored breathing.  Speech is clear and coherent with logical content.  Patient is alert and oriented at baseline.   Assessment and Plan: 1. Acute vaginitis No red flags Will empirically treat with 150 mg diflucan for yeast infection Discussed need for in-office visit for further evaluation if needed and these indications  Follow Up Instructions: I discussed the assessment and treatment plan with the patient. The patient was provided an opportunity to ask questions and all were answered. The patient agreed with the plan and demonstrated an understanding of the instructions.  A copy of instructions were sent to the patient via MyChart unless otherwise noted below.   The patient was advised to call back or seek an in-person evaluation if the symptoms worsen or if the condition fails to improve as anticipated.  Time:  I spent 5-10 minutes with the patient via telehealth technology discussing the above problems/concerns.    Inda Coke, [place]

## 2022-04-12 DIAGNOSIS — R5383 Other fatigue: Secondary | ICD-10-CM | POA: Diagnosis not present

## 2022-04-12 DIAGNOSIS — E7800 Pure hypercholesterolemia, unspecified: Secondary | ICD-10-CM | POA: Diagnosis not present

## 2022-04-12 DIAGNOSIS — Z79899 Other long term (current) drug therapy: Secondary | ICD-10-CM | POA: Diagnosis not present

## 2022-04-12 DIAGNOSIS — E785 Hyperlipidemia, unspecified: Secondary | ICD-10-CM | POA: Diagnosis not present

## 2022-04-12 DIAGNOSIS — F419 Anxiety disorder, unspecified: Secondary | ICD-10-CM | POA: Diagnosis not present

## 2022-04-12 DIAGNOSIS — E039 Hypothyroidism, unspecified: Secondary | ICD-10-CM | POA: Diagnosis not present

## 2022-04-12 DIAGNOSIS — E559 Vitamin D deficiency, unspecified: Secondary | ICD-10-CM | POA: Diagnosis not present

## 2022-05-07 ENCOUNTER — Ambulatory Visit
Admission: EM | Admit: 2022-05-07 | Discharge: 2022-05-07 | Disposition: A | Discharge status: Home / Self Care | Payer: Medicaid Other | Attending: Emergency Medicine | Admitting: Emergency Medicine

## 2022-05-07 DIAGNOSIS — Z7952 Long term (current) use of systemic steroids: Secondary | ICD-10-CM | POA: Diagnosis not present

## 2022-05-07 DIAGNOSIS — B349 Viral infection, unspecified: Secondary | ICD-10-CM | POA: Diagnosis not present

## 2022-05-07 DIAGNOSIS — J45901 Unspecified asthma with (acute) exacerbation: Secondary | ICD-10-CM | POA: Insufficient documentation

## 2022-05-07 DIAGNOSIS — R509 Fever, unspecified: Secondary | ICD-10-CM | POA: Diagnosis not present

## 2022-05-07 DIAGNOSIS — Z1152 Encounter for screening for COVID-19: Secondary | ICD-10-CM | POA: Insufficient documentation

## 2022-05-07 LAB — RESP PANEL BY RT-PCR (FLU A&B, COVID) ARPGX2
Influenza A by PCR: NEGATIVE
Influenza B by PCR: NEGATIVE
SARS Coronavirus 2 by RT PCR: NEGATIVE

## 2022-05-07 MED ORDER — PREDNISONE 10 MG PO TABS: 40.0000 mg | ORAL_TABLET | Freq: Every day | ORAL | 0 refills | Status: AC

## 2022-05-07 NOTE — ED Triage Notes (Signed)
Patient to Urgent Care with complaints of cough and wheezing. Headache and body aches. Diarrhea. Bilateral ear congestion. Symptoms started Thursday, improved over the weekend and returned yesterday morning.   Reports temp last night was 99.9, no temp this AM but still feeling hot/ cold.  Last dose of ibuprofen yesterday.

## 2022-05-07 NOTE — ED Provider Notes (Signed)
Julia Lyons    CSN: [id number] Arrival date & time: 05/07/22  9326      History   Chief Complaint Chief Complaint  Patient presents with   Fever    HPI Julia Lyons is a 31 y.o. female.  Patient presents with low-grade fever, chills, headache, body aches, ears stopped up, nonproductive cough, diarrhea x 5 days.  She reports some wheezing this morning but has not used her albuterol inhaler.  Last used albuterol inhaler 1 year ago.  Tmax 99.9.  No OTC medications taken today.  One episode of diarrhea today.  Patient denies shortness of breath, vomiting, rash, or other symptoms.  Her medical history includes asthma and seasonal allergies.  The history is provided by the patient and medical records.    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   Blood type O+ 07/21/2017   Seasonal allergic rhinitis due to pollen 09/27/2016   Exercise-induced asthma 10/22/2011    Past Surgical History:  Procedure Laterality Date   FLEXIBLE SIGMOIDOSCOPY N/A 12/05/2020   Procedure: FLEXIBLE SIGMOIDOSCOPY;  Surgeon: Jonathon Bellows, MD;  Location: [facility] ENDOSCOPY;  Service: Gastroenterology;  Laterality: N/A;   MOUTH SURGERY     WISDOM TOOTH EXTRACTION      OB History     Gravida  2   Para  2   Term  2   Preterm      AB      Living  2      SAB      IAB      Ectopic      Multiple  0   Live Births  2            Home Medications    Prior to Admission medications   Medication Sig Start Date End Date Taking? Authorizing Provider   predniSONE (DELTASONE) 10 MG tablet Take 4 tablets (40 mg total) by mouth daily for 5 days. 05/07/22 05/12/22 Yes Sharion Balloon, NP  albuterol (VENTOLIN HFA) 108 (90 Base) MCG/ACT inhaler Inhale 2 puffs into the lungs every 6 (six) hours as needed for wheezing or shortness of breath. 04/30/21   Jaynee Eagles, PA-C  ALPRAZolam Duanne Moron) 0.25 MG tablet Take 0.25 mg by mouth daily as needed. 10/10/20   [provider]  atorvastatin (LIPITOR) 10 MG tablet Take by mouth.    [provider]  cetirizine (ZYRTEC) 10 MG tablet Take 1 tablet (10 mg total) by mouth daily. 03/11/18   Kuneff, Renee A, DO  cholecalciferol (D-VI-SOL) 10 MCG/ML LIQD Vitamin D    [provider]  mometasone (ELOCON) 0.1 % lotion Apply topically daily. Apply to scalp once a day at bedtime for 2 weeks, then decrease to 5 nights a week PRN flares 06/04/21   Ralene Bathe, MD  Omega-3 Fatty Acids (FISH OIL PO) Take by mouth.    [provider]  Probiotic Product (PROBIOTIC DAILY PO) Take by mouth.    [provider]  Family History Family History  Problem Relation Age of Onset   Hyperlipidemia Father    Breast cancer Neg Hx    Colon cancer Neg Hx    Heart disease Neg Hx     Social History Social History   Tobacco Use   Smoking status: Never   Smokeless tobacco: Never  Vaping Use   Vaping Use: Never used  Substance Use Topics   Alcohol use: Yes    Alcohol/week: 14.0 standard drinks of alcohol    Types: 14 Glasses of wine per week   Drug use: No     Allergies   Depo-provera [medroxyprogesterone]   Review of Systems Review of Systems  Constitutional:  Positive for chills and fever.  HENT:  Positive for ear pain. Negative for ear discharge and sore throat.   Respiratory:  Positive for cough and wheezing. Negative for shortness of breath.   Cardiovascular:  Negative for chest pain and palpitations.  Gastrointestinal:  Positive for diarrhea. Negative for abdominal pain,  nausea and vomiting.  Skin:  Negative for color change and rash.  Neurological:  Positive for headaches.  All other systems reviewed and are negative.    Physical Exam Triage Vital Signs ED Triage Vitals  Enc Vitals Group     BP 05/07/22 1004 108/71     Pulse Rate 05/07/22 1004 75     Resp 05/07/22 1004 18     Temp 05/07/22 1004 98.6 F (37 C)     Temp src --      SpO2 05/07/22 1004 98 %     Weight 05/07/22 1001 126 lb (57.2 kg)     Height 05/07/22 1001 '5\' 1"'$  (1.549 m)     Head Circumference --      Peak Flow --      Pain Score 05/07/22 1001 5     Pain Loc --      Pain Edu? --      Excl. in [place]? --    No data found.  Updated Vital Signs BP 108/71   Pulse 75   Temp 98.6 F (37 C)   Resp 18   Ht '5\' 1"'$  (1.549 m)   Wt 126 lb (57.2 kg)   LMP 04/17/2022   SpO2 98%   BMI 23.81 kg/m   Visual Acuity Right Eye Distance:   Left Eye Distance:   Bilateral Distance:    Right Eye Near:   Left Eye Near:    Bilateral Near:     Physical Exam Vitals and nursing note reviewed.  Constitutional:      General: She is not in acute distress.    Appearance: Normal appearance. She is well-developed. She is not ill-appearing.  HENT:     Right Ear: Tympanic membrane normal.     Left Ear: Tympanic membrane normal.     Nose: Nose normal.     Mouth/Throat:     Mouth: Mucous membranes are moist.     Pharynx: Oropharynx is clear.  Cardiovascular:     Rate and Rhythm: Normal rate and regular rhythm.     Heart sounds: Normal heart sounds.  Pulmonary:     Effort: Pulmonary effort is normal. No respiratory distress.     Breath sounds: Normal breath sounds. No wheezing.  Abdominal:     General: Bowel sounds are normal.     Palpations: Abdomen is soft.     Tenderness: There is no abdominal tenderness. There is no guarding or rebound.  Musculoskeletal:  Cervical back: Neck supple.  Skin:    General: Skin is warm and dry.  Neurological:     Mental Status: She is alert.   Psychiatric:        Mood and Affect: Mood normal.        Behavior: Behavior normal.      UC Treatments / Results  Labs (all labs ordered are listed, but only abnormal results are displayed) Labs Reviewed  RESP PANEL BY RT-PCR (FLU A&B, COVID) [id number]    EKG   Radiology No results found.  Procedures Procedures (including critical care time)  Medications Ordered in UC Medications - No data to display  Initial Impression / Assessment and Plan / UC Course  I have reviewed the triage vital signs and the nursing notes.  Pertinent labs & imaging results that were available during my care of the patient were reviewed by me and considered in my medical decision making (see chart for details).    Fever, asthma exacerbation, viral illness.  She reports wheezing this morning.  None currently, lungs are clear, no respiratory distress, O2 sat 98% on room air.  Treating with prednisone and encouraged patient to use her albuterol inhaler as directed.  COVID and Flu pending.  Discussed symptomatic treatment including Tylenol or ibuprofen, rest, hydration.  Instructed patient to follow up with PCP if symptoms are not improving.  She agrees to plan of care.   Final Clinical Impressions(s) / UC Diagnoses   Final diagnoses:  Fever, unspecified  Asthma with acute exacerbation, unspecified asthma severity, unspecified whether persistent  Viral illness     Discharge Instructions      Take the prednisone as directed.  Use your albuterol inhaler as directed.    Your COVID and Flu tests are pending.    Take Tylenol or ibuprofen as needed for fever or discomfort.  Rest and keep yourself hydrated.    Follow-up with your primary care provider if your symptoms are not improving.         ED Prescriptions     Medication Sig Dispense Auth. Provider   predniSONE (DELTASONE) 10 MG tablet Take 4 tablets (40 mg total) by mouth daily for 5 days. 20 tablet Sharion Balloon, NP      PDMP not  reviewed this encounter.   Sharion Balloon, NP 05/07/22 1044

## 2022-05-07 NOTE — Discharge Instructions (Addendum)
Take the prednisone as directed.  Use your albuterol inhaler as directed.    Your COVID and Flu tests are pending.    Take Tylenol or ibuprofen as needed for fever or discomfort.  Rest and keep yourself hydrated.    Follow-up with your primary care provider if your symptoms are not improving.

## 2022-06-10 ENCOUNTER — Ambulatory Visit: Payer: Medicaid Other | Admitting: Dermatology

## 2022-06-12 ENCOUNTER — Telehealth (INDEPENDENT_AMBULATORY_CARE_PROVIDER_SITE_OTHER): Payer: Self-pay | Admitting: Vascular Surgery

## 2022-06-12 NOTE — Telephone Encounter (Signed)
LVM for pt advising of the status of her laser ablation procedure recommended by Dr. Delana Meyer. I advised that the supplies needed for the procedure are still on back order. I explained the process of having a list to keep track of the order to schedule patients, the PA process and then I would give a call to get patients scheduled. I advised to call if pt had any additional questions.

## 2022-06-13 ENCOUNTER — Ambulatory Visit: Payer: Medicaid Other | Admitting: Dermatology

## 2022-06-20 ENCOUNTER — Ambulatory Visit: Payer: Medicaid Other | Admitting: Dermatology

## 2022-06-20 DIAGNOSIS — L409 Psoriasis, unspecified: Secondary | ICD-10-CM | POA: Diagnosis not present

## 2022-06-20 DIAGNOSIS — Z79899 Other long term (current) drug therapy: Secondary | ICD-10-CM | POA: Diagnosis not present

## 2022-06-20 MED ORDER — MOMETASONE FUROATE 0.1 % EX SOLN: Freq: Every day | CUTANEOUS | 3 refills | Status: AC

## 2022-06-20 MED ORDER — KETOCONAZOLE 2 % EX SHAM: MEDICATED_SHAMPOO | CUTANEOUS | 11 refills | Status: AC

## 2022-06-20 MED ORDER — CLOBEX 0.05 % EX SHAM: MEDICATED_SHAMPOO | CUTANEOUS | 11 refills | Status: AC

## 2022-06-20 NOTE — Progress Notes (Signed)
   Follow-Up Visit   Subjective  Julia Lyons is a 32 y.o. female who presents for the following: Psoriasis (1 year follow up psoriasis follow up on scalp. ). The patient has spots, moles and lesions to be evaluated, some may be new or changing and the patient has concerns that these could be cancer.  The following portions of the chart were reviewed this encounter and updated as appropriate:  Tobacco  Allergies  Meds  Problems  Med Hx  Surg Hx  Fam Hx     Review of Systems: No other skin or systemic complaints except as noted in HPI or Assessment and Plan.  Objective  Well appearing patient in no apparent distress; mood and affect are within normal limits.  A focused examination was performed including scalp. Relevant physical exam findings are noted in the Assessment and Plan.  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  Continue alternating salicylic acid shampoo and ketoconazole shampoo. Using clobetasol shampoo twice a week, and using  mometasone solution up to 5 days a week as needed for flares.    Psoriasis is a chronic non-curable, but treatable genetic/hereditary disease that may have other systemic features affecting other organ systems such as joints (Psoriatic Arthritis). It is associated with an increased risk of inflammatory bowel disease, heart disease, non-alcoholic fatty liver disease, and depression.     Topical steroids (such as triamcinolone, fluocinolone, fluocinonide, mometasone, clobetasol, halobetasol, betamethasone, hydrocortisone) can cause thinning and lightening of the skin if they are used for too long in the same area. Your physician has selected the right strength medicine for your problem and area affected on the body. Please use your medication only as directed by your physician to prevent side effects.   mometasone (ELOCON) 0.1 % lotion - Scalp Apply topically daily. Apply to  scalp once a day at bedtime for 2 weeks, then decrease to 5 nights a week PRN flares CLOBEX 0.05 % shampoo - Scalp Shampoo into scalp let sit 10 minutes then wash out. Use 2 times per week. ketoconazole (NIZORAL) 2 % shampoo - Scalp Shampoo into scalp let sit 10 minutes then wash out. Use twice per week.  Return in about 1 year (around 06/21/2023) for psoriasis / tbse .  IAsher [name], CMA, am acting as scribe for Armida Sans, MD. Documentation: I have reviewed the above documentation for accuracy and completeness, and I agree with the above.  Armida Sans, MD

## 2022-06-20 NOTE — Patient Instructions (Addendum)
     Melanoma ABCDEs  Melanoma is the most dangerous type of skin cancer, and is the leading cause of death from skin disease.  You are more likely to develop melanoma if you: Have light-colored skin, light-colored eyes, or red or blond hair Spend a lot of time in the sun Tan regularly, either outdoors or in a tanning bed Have had blistering sunburns, especially during childhood Have a close family member who has had a melanoma Have atypical moles or large birthmarks  Early detection of melanoma is key since treatment is typically straightforward and cure rates are extremely high if we catch it early.   The first sign of melanoma is often a change in a mole or a new dark spot.  The ABCDE system is a way of remembering the signs of melanoma.  A for asymmetry:  The two halves do not match. B for border:  The edges of the growth are irregular. C for color:  A mixture of colors are present instead of an even brown color. D for diameter:  Melanomas are usually (but not always) greater than 6mm - the size of a pencil eraser. E for evolution:  The spot keeps changing in size, shape, and color.  Please check your skin once per month between visits. You can use a small mirror in front and a large mirror behind you to keep an eye on the back side or your body.   If you see any new or changing lesions before your next follow-up, please call to schedule a visit.  Please continue daily skin protection including broad spectrum sunscreen SPF 30+ to sun-exposed areas, reapplying every 2 hours as needed when you're outdoors.   Staying in the shade or wearing long sleeves, sun glasses (UVA+UVB protection) and wide brim hats (4-inch brim around the entire circumference of the hat) are also recommended for sun protection.    Due to recent changes in healthcare laws, you may see results of your pathology and/or laboratory studies on MyChart before the doctors have had a chance to review them. We  understand that in some cases there may be results that are confusing or concerning to you. Please understand that not all results are received at the same time and often the doctors may need to interpret multiple results in order to provide you with the best plan of care or course of treatment. Therefore, we ask that you please give us 2 business days to thoroughly review all your results before contacting the office for clarification. Should we see a critical lab result, you will be contacted sooner.   If You Need Anything After Your Visit  If you have any questions or concerns for your doctor, please call our main line at [phone] and press option 4 to reach your doctor's medical assistant. If no one answers, please leave a voicemail as directed and we will return your call as soon as possible. Messages left after 4 pm will be answered the following business day.   You may also send us a message via MyChart. We typically respond to MyChart messages within 1-2 business days.  For prescription refills, please ask your pharmacy to contact our office. Our fax number is [phone].  If you have an urgent issue when the clinic is closed that cannot wait until the next business day, you can page your doctor at the number below.    Please note that while we do our best to be available for urgent issues   outside of office hours, we are not available 24/7.   If you have an urgent issue and are unable to reach us, you may choose to seek medical care at your doctor's office, retail clinic, urgent care center, or emergency room.  If you have a medical emergency, please immediately call 911 or go to the emergency department.  Pager Numbers  - Dr. Kowalski: [phone]  - Dr. Moye: [phone]  - Dr. Stewart: [phone]  In the event of inclement weather, please call our main line at [phone] for an update on the status of any delays or closures.  Dermatology Medication Tips: Please  keep the boxes that topical medications come in in order to help keep track of the instructions about where and how to use these. Pharmacies typically print the medication instructions only on the boxes and not directly on the medication tubes.   If your medication is too expensive, please contact our office at [phone] option 4 or send us a message through MyChart.   We are unable to tell what your co-pay for medications will be in advance as this is different depending on your insurance coverage. However, we may be able to find a substitute medication at lower cost or fill out paperwork to get insurance to cover a needed medication.   If a prior authorization is required to get your medication covered by your insurance company, please allow us 1-2 business days to complete this process.  Drug prices often vary depending on where the prescription is filled and some pharmacies may offer cheaper prices.  The website www.goodrx.com contains coupons for medications through different pharmacies. The prices here do not account for what the cost may be with help from insurance (it may be cheaper with your insurance), but the website can give you the price if you did not use any insurance.  - You can print the associated coupon and take it with your prescription to the pharmacy.  - You may also stop by our office during regular business hours and pick up a GoodRx coupon card.  - If you need your prescription sent electronically to a different pharmacy, notify our office through [place] MyChart or by phone at [phone] option 4.     Si Usted Necesita Algo Despus de Su Visita  Tambin puede enviarnos un mensaje a travs de MyChart. Por lo general respondemos a los mensajes de MyChart en el transcurso de 1 a 2 das hbiles.  Para renovar recetas, por favor pida a su farmacia que se ponga en contacto con nuestra oficina. Nuestro nmero de fax es el [phone].  Si tiene un asunto urgente  cuando la clnica est cerrada y que no puede esperar hasta el siguiente da hbil, puede llamar/localizar a su doctor(a) al nmero que aparece a continuacin.   Por favor, tenga en cuenta que aunque hacemos todo lo posible para estar disponibles para asuntos urgentes fuera del horario de oficina, no estamos disponibles las 24 horas del da, los 7 das de la semana.   Si tiene un problema urgente y no puede comunicarse con nosotros, puede optar por buscar atencin mdica  en el consultorio de su doctor(a), en una clnica privada, en un centro de atencin urgente o en una sala de emergencias.  Si tiene una emergencia mdica, por favor llame inmediatamente al 911 o vaya a la sala de emergencias.  Nmeros de bper  - Dr. Kowalski: [phone]  - Dra. Moye: [phone]  - Dra. Stewart: [phone]  En caso   de inclemencias del tiempo, por favor llame a nuestra lnea principal al [phone] para una actualizacin sobre el estado de cualquier retraso o cierre.  Consejos para la medicacin en dermatologa: Por favor, guarde las cajas en las que vienen los medicamentos de uso tpico para ayudarle a seguir las instrucciones sobre dnde y cmo usarlos. Las farmacias generalmente imprimen las instrucciones del medicamento slo en las cajas y no directamente en los tubos del medicamento.   Si su medicamento es muy caro, por favor, pngase en contacto con nuestra oficina llamando al [phone] y presione la opcin 4 o envenos un mensaje a travs de MyChart.   No podemos decirle cul ser su copago por los medicamentos por adelantado ya que esto es diferente dependiendo de la cobertura de su seguro. Sin embargo, es posible que podamos encontrar un medicamento sustituto a menor costo o llenar un formulario para que el seguro cubra el medicamento que se considera necesario.   Si se requiere una autorizacin previa para que su compaa de seguros cubra su medicamento, por favor permtanos de 1 a 2  das hbiles para completar este proceso.  Los precios de los medicamentos varan con frecuencia dependiendo del lugar de dnde se surte la receta y alguna farmacias pueden ofrecer precios ms baratos.  El sitio web www.goodrx.com tiene cupones para medicamentos de diferentes farmacias. Los precios aqu no tienen en cuenta lo que podra costar con la ayuda del seguro (puede ser ms barato con su seguro), pero el sitio web puede darle el precio si no utiliz ningn seguro.  - Puede imprimir el cupn correspondiente y llevarlo con su receta a la farmacia.  - Tambin puede pasar por nuestra oficina durante el horario de atencin regular y recoger una tarjeta de cupones de GoodRx.  - Si necesita que su receta se enve electrnicamente a una farmacia diferente, informe a nuestra oficina a travs de MyChart de [place] o por telfono llamando al [phone] y presione la opcin 4.  

## 2022-06-28 ENCOUNTER — Encounter: Payer: Self-pay | Admitting: Dermatology

## 2022-07-11 DIAGNOSIS — F329 Major depressive disorder, single episode, unspecified: Secondary | ICD-10-CM | POA: Diagnosis not present

## 2022-07-11 DIAGNOSIS — Z79899 Other long term (current) drug therapy: Secondary | ICD-10-CM | POA: Diagnosis not present

## 2022-07-11 DIAGNOSIS — E039 Hypothyroidism, unspecified: Secondary | ICD-10-CM | POA: Diagnosis not present

## 2022-07-11 DIAGNOSIS — E785 Hyperlipidemia, unspecified: Secondary | ICD-10-CM | POA: Diagnosis not present

## 2022-07-11 DIAGNOSIS — E559 Vitamin D deficiency, unspecified: Secondary | ICD-10-CM | POA: Diagnosis not present

## 2022-07-11 DIAGNOSIS — H6123 Impacted cerumen, bilateral: Secondary | ICD-10-CM | POA: Diagnosis not present

## 2022-07-11 DIAGNOSIS — R5383 Other fatigue: Secondary | ICD-10-CM | POA: Diagnosis not present

## 2022-07-11 DIAGNOSIS — R799 Abnormal finding of blood chemistry, unspecified: Secondary | ICD-10-CM | POA: Diagnosis not present

## 2022-07-11 DIAGNOSIS — F419 Anxiety disorder, unspecified: Secondary | ICD-10-CM | POA: Diagnosis not present

## 2022-07-12 ENCOUNTER — Telehealth: Payer: Medicaid Other | Admitting: Family Medicine

## 2022-07-12 DIAGNOSIS — J020 Streptococcal pharyngitis: Secondary | ICD-10-CM

## 2022-07-12 MED ORDER — AMOXICILLIN 875 MG PO TABS: 875.0000 mg | ORAL_TABLET | Freq: Two times a day (BID) | ORAL | 0 refills | Status: AC

## 2022-07-12 NOTE — Progress Notes (Signed)
Virtual Visit Consent   Julia Lyons, you are scheduled for a virtual visit with a [facility] provider today. Just as with appointments in the office, your consent must be obtained to participate. Your consent will be active for this visit and any virtual visit you may have with one of our providers in the next 365 days. If you have a MyChart account, a copy of this consent can be sent to you electronically.  As this is a virtual visit, video technology does not allow for your provider to perform a traditional examination. This may limit your provider's ability to fully assess your condition. If your provider identifies any concerns that need to be evaluated in person or the need to arrange testing (such as labs, EKG, etc.), we will make arrangements to do so. Although advances in technology are sophisticated, we cannot ensure that it will always work on either your end or our end. If the connection with a video visit is poor, the visit may have to be switched to a telephone visit. With either a video or telephone visit, we are not always able to ensure that we have a secure connection.  By engaging in this virtual visit, you consent to the provision of healthcare and authorize for your insurance to be billed (if applicable) for the services provided during this visit. Depending on your insurance coverage, you may receive a charge related to this service.  I need to obtain your verbal consent now. Are you willing to proceed with your visit today? Julia Lyons has provided verbal consent on 07/12/2022 for a virtual visit (video or telephone). Dellia Nims, FNP  Date: 07/12/2022 3:40 PM  Virtual Visit via Video Note   I, Dellia Nims, connected with  Julia Lyons  ([id number], Jun 09, 1990) on 07/12/22 at  3:45 PM EST by a video-enabled telemedicine application and verified that I am speaking with the correct person using two identifiers.  Location: Patient: Virtual Visit Location  Patient: Home Provider: Virtual Visit Location Provider: Home Office   I discussed the limitations of evaluation and management by telemedicine and the availability of in person appointments. The patient expressed understanding and agreed to proceed.    History of Present Illness: Julia Lyons is a 32 y.o. who identifies as a female who was assigned female at birth, and is being seen today for exposure to her son with strep throat last week. She now has sore throat, nausea, headache and fever for 2 days.  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   Blood type O+ 07/21/2017   Seasonal allergic rhinitis due to pollen 09/27/2016   Exercise-induced asthma 10/22/2011    Allergies:  Allergies  Allergen Reactions   Depo-Provera [Medroxyprogesterone] Other (See Comments)    Severe migraine and muscle spasms   Medications:  Current Outpatient Medications:    albuterol (VENTOLIN HFA) 108 (90 Base) MCG/ACT inhaler, Inhale 2 puffs into the lungs every 6 (six) hours as needed for wheezing or shortness of breath., Disp: 18 g, Rfl: 0   ALPRAZolam (XANAX) 0.25 MG tablet, Take 0.25 mg by mouth daily as needed., Disp: , Rfl:    atorvastatin (LIPITOR) 10 MG tablet, Take by mouth., Disp: , Rfl:    cetirizine (ZYRTEC) 10 MG tablet, Take 1 tablet (10 mg total) by mouth daily., Disp: 90 tablet, Rfl: 3   cholecalciferol (  D-VI-SOL) 10 MCG/ML LIQD, Vitamin D, Disp: , Rfl:    CLOBEX 0.05 % shampoo, Shampoo into scalp let sit 10 minutes then wash out. Use 2 times per week., Disp: 118 mL, Rfl: 11   ketoconazole (NIZORAL) 2 % shampoo, Shampoo into scalp let sit 10 minutes then wash out. Use twice per week., Disp: 120 mL, Rfl: 11   mometasone (ELOCON) 0.1 % lotion, Apply topically daily. Apply to scalp once a day at bedtime for 2 weeks, then decrease to 5  nights a week PRN flares, Disp: 60 mL, Rfl: 3   Omega-3 Fatty Acids (FISH OIL PO), Take by mouth., Disp: , Rfl:    Probiotic Product (PROBIOTIC DAILY PO), Take by mouth., Disp: , Rfl:   Observations/Objective: Patient is well-developed, well-nourished in no acute distress.  Resting comfortably  at home.  Head is normocephalic, atraumatic.  No labored breathing.  Speech is clear and coherent with logical content.  Patient is alert and oriented at baseline.    Assessment and Plan: 1. Strep throat  Increase fluids, warm salt water gargles, ibuprofen as directed, urgent care if sx worsen.   Follow Up Instructions: I discussed the assessment and treatment plan with the patient. The patient was provided an opportunity to ask questions and all were answered. The patient agreed with the plan and demonstrated an understanding of the instructions.  A copy of instructions were sent to the patient via MyChart unless otherwise noted below.     The patient was advised to call back or seek an in-person evaluation if the symptoms worsen or if the condition fails to improve as anticipated.  Time:  I spent 10 minutes with the patient via telehealth technology discussing the above problems/concerns.    Dellia Nims, FNP

## 2022-07-12 NOTE — Patient Instructions (Signed)
?????? ????? ??? ???????? Strep Throat, Adult ?????? ????? ???? ??????? ???? ?????. ?????? ???? ?????? ??????? ?? ?????. ??????? ?? ???? ?????? ????? ???????? ??????? ????? ?????? ??????? ??? 5-15 ?????. ??? ??? ???? ?? ???? ??????? ?? ?? ?? ??? ?? ??? ?? ?????. ?????? ??? ?????? ?? ??? ???? ??????? ?? ?????? ?? ???????? ?? ???. ?? ?????? ??????? ??????? ?????? ??????? ?????? ???? ???? ??????. ?????? ???? ?????? ????? ????????? ????? ?????? ????????. ??? ????? ???? ????? ?????? ???? ????? ?????? ???????. ?? ????? ??? ??????? ??? ?????? ?????? ??????? ???????? ??????? ???????. ?? ????? ????? ?????? ???? ????? ???????? ??????? ???? ?????? ?? ?????? ???????:  ???? ????? ??????? ?? ?? ???????? ?? ???????? ???? ????? ???????. ???????? ???? ???? ??????? ??????? ????? ????? ?? ?????? ???????.  ?????? ?? ??????? ???????? ??? ???? ?????? ?? ????? ??????.  ??????? ?? ??? ?? ??? ???? ??????? ????? ????????. ?? ?????? ??? ?????? ?? ???????? ???? ????? ??? ?????? ?? ???:  ??? ?? ???????.  ?????? ?? ???? ?? ??? ?? ???????? ?? ?????.  ??? ?? ????? ?? ?????.  ???? ??? ????? ?? ????? ??? ???????? ?? ?????.  ??? ??? ??? ????? ???? ???? ?????? ?? ???? ????.  ???? ????? ????? ?????? ?????.  ??? ???? ???? ???? ???? ????? ???. ???? ??? ???? ??????. ??? ????? ??? ???????  ????? ??? ?????? ??????? ????? ?? ????????? ???? ???? ?????? ????? ????????? ?? ???? ?????? ?? ???? ??????. ???:  ??? ??????? ??????. ??????? ??? ?????? ???? ???? ?? ???? ?????? ??????? ?? ???? ???????. ?????? ???? ???????? ?? ???? ?????.  ????? ????? ?????. ??????? ??? ?????? ???? ???? ?? ????. ?? ????? ?????? ?? ??? ???? ?????? ??????. ????? ??????? ???? ?? ???? ??? ?? ?????. ??? ?????? ??? ??????? ???? ???? ??? ?????? ????????? ??????:  ??????? ???? ???? ???????? (???????? ???????).  ??????? ??????? ????? ?? ??????? ???????. ????? ???: ? ?????????? ?? ????????????. ? ????????? ??? ????? ??? ??????? ?????? ?? 18 ?????. ? ???? ????? ??????  ?????. ? ?????? ?????. ???? ??? ????????? ?? ??????: ???????   ????? ??????? ??? ??????? ???? ??????? ??????? ????? ????? ????? ???? ?? ??? ???? ????.  ????? ???????? ??????? ??? ??????? ??????? ??????? ?????? ??????? ??. ?? ????? ?? ????? ?????? ?????? ??? ?? ???? ???? ???????. ????? ??????   ?? ???? ????? ?????? ???? ????? ??????? ?????? ??? ??? ?????? ?????.  ???? ??? ??? ????? ????? ?? ??????? ???? ??? ???? ???? ??????.  ???????? ?? ????? ?????? ???? ?? ??????:  ? ??????? ???????? ??? ?????? ??????. ? ??????? ??????? ??? ???????? ??????? ?? ???????? ???????. ??????? ????  ?????? ??????? ????? ????? ?????? ?? 3-4 ???? ?????? ?? ??? ??????. ?????? ????? ????? ??????? ??? ??? ????? ??? ????? ????? (3-6 ??) ?? ????? ?? ??? ??? ???? (237 ??).  ???? ??? ??? ???? ?? ??????.  ???? ??? ?????? ?? ??????? ???? ?????? ??? ????? ?? ??????? ??? ??? 24 ???? ?? ??? ????? ???????? ???????.  ?? ?????? ??? ?????? ?????? ????? ??? ????????? ?? ?????. ????? ??? ???????? ??????? ??????? ?? ??????????? ?? ??? ?????. ????? ???? ??????? ?????? ??? ??? ????? ??? ???????? ??????? ?? ???????.  ??????? ??? ????? ?????????? ???????? ???? ?????? ?????. ??? ???? ???? ??????? ?????? ?? ????? ??????? ?? ????? ???????? ?? ???? ?????? ???????.  ????? ????? ?????? ????????. ???? ??? ???. ??? ????? ??????? ?? ????   ?? ????? ?????? ?? ????? ????? ?? ??????? ?????????? ??????? ???? ?? ?????? ?? ????? ?? ?????? ?????? ???????.  ???? ???? ?????? ?????? ???????? ????  20 ????? ??? ?????. ???? ?? ????? ????? ????????? ????? ??????? ???? ??????. ???? ???? ??? ?? ???? ???? ?? ?????? ???? ?????.  ???? ????? ?????? ????? ?????? ?? ?????? ?? ????? ?? ??? ?????? ????? ????? ?? ?????? ????? ????????. ????? ?? ????? ??? ????? ???? ???? ??? ????? ?????? ??. ???? ????? ??????? ?????? ?? ????:  ??????? ????? ?? ?????? ?? ????? ????.  ??????? ???? ?? ???? ?? ??? ??????.  ???? ???? ??? ??? ?????? ???? ???? ?? ???? ???? ??? ????? ?? ?????  ???.  ??????? ???? ?? ??? ????? ???? ?? ?? ????? ?? ????? ??????.  ??????? ????? ???????.  ??????? ????. ???? ???????? ????? ?? ??????? ???????:  ?????? ???? ????? ????? ??? ?????? ?? ?????? ????? ?? ???? ????? ?? ?????? ???? ???? ?? ??? ?? ?????? ?? ??? ?? ??????.  ?????? ???? ??? ?? ????? ?? ???? ???? ???? ?? ???? ?????? ?? ?????.  ??????? ????? ?? ?????? ?? ?????? ????? ??? ?????? ?? ?????? ???? ??.  ??????? ??????? ?????? ??? ????? (???????) ????? ???? ???? ??????.  ????? ?????? ??????? ?? ??? ?????? ??? ????????? ??????.  ?????? ??????? ?? ?????? ?????? ???. ???? ???? ??? ??????? ??? ????? ????? ????? ???? ???? ?????. ??? ?? ????? ??? ?? ???? ???????. ?? ???? ???????? ?????? ??? ?????. ???? ?????? ??????? ??????? (911 ?? ???????? ??????? ?????????). ?? ??? ??????? ????? ??? ????????. ????  ?????? ????? ???? ???? ????? ???? ??????? ????? ??????? ???????. ?????? ??? ?????? ?? ??? ???? ???? ?????? ?? ?????? ?? ???????? ?? ???.  ????? ???????? ??? ???? ???????? ???????? ??? ??????? ??????? ??????? ?????? ??????? ??. ?? ????? ?? ????? ?????? ?????? ??? ?? ???? ???? ???????.  ???? ?????? ????????? ???? ???? ????? ?????? ????????. ????? ?? ??????? ?????? ????. ?? ?????? ?????? ?? ??????? ?? ??????? ??????? ?? ???????.  ???? ???????? ????? ?? ???? ?????? ?????? ????? ??? ????? ?? ?????? ?????? ?? ???? ????? ?? ?????? ???? ??? ?? ??? ?? ????? ?? ??? ?? ??????. ??? ????? ?? ??? ????????? ?? ???? ?????? ????????? ???? ?????? ???? ??????? ??????. ???? ?? ?????? ??? ????? ???? ?? ???? ?? ???? ??????? ??????.? Document Revised: 10/05/2020 Document Reviewed: 10/05/2020 Elsevier Patient Education  [place].

## 2022-08-13 ENCOUNTER — Telehealth: Payer: Self-pay | Admitting: Gastroenterology

## 2022-08-13 NOTE — Telephone Encounter (Signed)
Pt called to sched appointment when June sched opens

## 2022-09-18 ENCOUNTER — Telehealth: Payer: Medicaid Other | Admitting: Nurse Practitioner

## 2022-09-18 DIAGNOSIS — J40 Bronchitis, not specified as acute or chronic: Secondary | ICD-10-CM

## 2022-09-18 DIAGNOSIS — J4521 Mild intermittent asthma with (acute) exacerbation: Secondary | ICD-10-CM | POA: Diagnosis not present

## 2022-09-18 MED ORDER — PREDNISONE 20 MG PO TABS: 20.0000 mg | ORAL_TABLET | Freq: Two times a day (BID) | ORAL | 0 refills | Status: AC

## 2022-09-18 MED ORDER — AZITHROMYCIN 250 MG PO TABS: ORAL_TABLET | ORAL | 0 refills | Status: AC

## 2022-09-18 NOTE — Progress Notes (Signed)
Virtual Visit Consent   Julia Lyons, you are scheduled for a virtual visit with a [facility] provider today. Just as with appointments in the office, your consent must be obtained to participate. Your consent will be active for this visit and any virtual visit you may have with one of our providers in the next 365 days. If you have a MyChart account, a copy of this consent can be sent to you electronically.  As this is a virtual visit, video technology does not allow for your provider to perform a traditional examination. This may limit your provider's ability to fully assess your condition. If your provider identifies any concerns that need to be evaluated in person or the need to arrange testing (such as labs, EKG, etc.), we will make arrangements to do so. Although advances in technology are sophisticated, we cannot ensure that it will always work on either your end or our end. If the connection with a video visit is poor, the visit may have to be switched to a telephone visit. With either a video or telephone visit, we are not always able to ensure that we have a secure connection.  By engaging in this virtual visit, you consent to the provision of healthcare and authorize for your insurance to be billed (if applicable) for the services provided during this visit. Depending on your insurance coverage, you may receive a charge related to this service.  I need to obtain your verbal consent now. Are you willing to proceed with your visit today? Julia Lyons has provided verbal consent on 09/18/2022 for a virtual visit (video or telephone). Viviano Simas, FNP  Date: 09/18/2022 9:13 AM  Virtual Visit via Video Note   I, Viviano Simas, connected with  Julia Lyons  ([id number], 1991/01/27) on 09/18/22 at  9:15 AM EDT by a video-enabled telemedicine application and verified that I am speaking with the correct person using two identifiers.  Location: Patient: Virtual Visit Location  Patient: Home Provider: Virtual Visit Location Provider: Home Office   I discussed the limitations of evaluation and management by telemedicine and the availability of in person appointments. The patient expressed understanding and agreed to proceed.    History of Present Illness: Julia Lyons is a 32 y.o. who identifies as a female who was assigned female at birth, and is being seen today for a cough and congestion.  Symptom onset was three weeks ago Initially she had a mild cough and sore throat with headache/body aches Body aches resolved within 48 hours  Productive cough has persisted  She has been using Delsym, sudafed and Zyrtec daily   She has some tightness especially in the morning, she has bene using her Albuterol for relief   Last night she started to notice more of a sore throat and right ear pain.  She feels that her nose has not been congested   Problems:  Patient Active Problem List   Diagnosis Date Noted   Fever, unspecified 05/07/2022   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   Blood type O+ 07/21/2017   Seasonal allergic rhinitis due to pollen 09/27/2016   Exercise-induced asthma 10/22/2011    Allergies:  Allergies  Allergen Reactions   Depo-Provera [Medroxyprogesterone] Other (See Comments)    Severe migraine and muscle spasms   Medications:  Current Outpatient Medications:    albuterol (VENTOLIN HFA) 108 (90 Base) MCG/ACT inhaler, Inhale 2 puffs into the lungs every 6 (six) hours as needed for wheezing  or shortness of breath., Disp: 18 g, Rfl: 0   ALPRAZolam (XANAX) 0.25 MG tablet, Take 0.25 mg by mouth daily as needed., Disp: , Rfl:    atorvastatin (LIPITOR) 10 MG tablet, Take by mouth., Disp: , Rfl:    cetirizine (ZYRTEC) 10 MG tablet, Take 1 tablet (10 mg total) by mouth daily., Disp: 90 tablet, Rfl: 3   cholecalciferol (D-VI-SOL) 10 MCG/ML LIQD, Vitamin D, Disp: , Rfl:    CLOBEX 0.05 % shampoo,  Shampoo into scalp let sit 10 minutes then wash out. Use 2 times per week., Disp: 118 mL, Rfl: 11   ketoconazole (NIZORAL) 2 % shampoo, Shampoo into scalp let sit 10 minutes then wash out. Use twice per week., Disp: 120 mL, Rfl: 11   mometasone (ELOCON) 0.1 % lotion, Apply topically daily. Apply to scalp once a day at bedtime for 2 weeks, then decrease to 5 nights a week PRN flares, Disp: 60 mL, Rfl: 3   Omega-3 Fatty Acids (FISH OIL PO), Take by mouth., Disp: , Rfl:    Probiotic Product (PROBIOTIC DAILY PO), Take by mouth., Disp: , Rfl:   Observations/Objective: Patient is well-developed, well-nourished in no acute distress.  Resting comfortably  at home.  Head is normocephalic, atraumatic.  No labored breathing.  Speech is clear and coherent with logical content.  Patient is alert and oriented at baseline.    Assessment and Plan: 1. Bronchitis 2. Mild intermittent asthma with acute exacerbation Meds ordered this encounter  Medications   predniSONE (DELTASONE) 20 MG tablet    Sig: Take 1 tablet (20 mg total) by mouth 2 (two) times daily with a meal for 5 days.    Dispense:  10 tablet    Refill:  0   azithromycin (ZITHROMAX) 250 MG tablet    Sig: Take 2 tablets on day 1, then 1 tablet daily on days 2 through 5    Dispense:  6 tablet    Refill:  0   Continue Mucinex and Albuterol as needed       Follow Up Instructions: I discussed the assessment and treatment plan with the patient. The patient was provided an opportunity to ask questions and all were answered. The patient agreed with the plan and demonstrated an understanding of the instructions.  A copy of instructions were sent to the patient via MyChart unless otherwise noted below.   The patient was advised to call back or seek an in-person evaluation if the symptoms worsen or if the condition fails to improve as anticipated.  Time:  I spent 15 minutes with the patient via telehealth technology discussing the above  problems/concerns.    Viviano Simas, FNP

## 2022-09-19 ENCOUNTER — Ambulatory Visit: Payer: Medicaid Other | Admitting: Obstetrics & Gynecology

## 2022-09-19 ENCOUNTER — Other Ambulatory Visit (HOSPITAL_COMMUNITY)
Admission: RE | Admit: 2022-09-19 | Discharge: 2022-09-19 | Disposition: A | Discharge status: Home / Self Care | Payer: Medicaid Other | Source: Ambulatory Visit | Attending: Obstetrics & Gynecology | Admitting: Obstetrics & Gynecology

## 2022-09-19 ENCOUNTER — Encounter: Payer: Self-pay | Admitting: Obstetrics & Gynecology

## 2022-09-19 VITALS — BP 120/82 | HR 62 | Ht 61.0 in | Wt 122.0 lb

## 2022-09-19 DIAGNOSIS — Z1339 Encounter for screening examination for other mental health and behavioral disorders: Secondary | ICD-10-CM | POA: Diagnosis not present

## 2022-09-19 DIAGNOSIS — K649 Unspecified hemorrhoids: Secondary | ICD-10-CM | POA: Diagnosis not present

## 2022-09-19 DIAGNOSIS — Z01419 Encounter for gynecological examination (general) (routine) without abnormal findings: Secondary | ICD-10-CM | POA: Diagnosis not present

## 2022-09-19 NOTE — Progress Notes (Signed)
GYNECOLOGY ANNUAL PREVENTATIVE CARE ENCOUNTER NOTE  History:     Julia Lyons is a 32 y.o. G40P2002 female here for a routine annual gynecologic exam.  Current complaints: desires referral to General Surgery for removal of her hemorrhoid. Has banding previously, considering surgical management now.  Denies abnormal vaginal bleeding, discharge, pelvic pain, problems with intercourse or other gynecologic concerns.    Gynecologic History Patient's last menstrual period was 09/09/2022. Contraception: none Last Pap: 2023. Result was normal, desires one today  Obstetric History OB History  Gravida Para Term Preterm AB Living  SAB IAB Ectopic Multiple Live Births        0 2    # Outcome Date GA Lbr Len/2nd Weight Sex Delivery Anes PTL Lv  2 Term 11/17/17 [redacted]w[redacted]d 03:07 / 00:12 8 lb 14 oz (4.025 kg) M Vag-Spont EPI  LIV  1 Term 05/23/14 [redacted]w[redacted]d 05:02 / 00:38 8 lb 1.4 oz (3.668 kg) F Vag-Spont Local  LIV    Past Medical History:  Diagnosis Date   Allergy    Asthma    Chronic constipation    Cough 06/07/2008   Qualifier: Diagnosis of  By: Ermalene Searing MD, Amy     Hemorrhoid    History of chlamydia 06/2011   treated with doxy   Pregnancy 11/17/2017   Superficial thrombophlebitis 09/2015   right LE    Past Surgical History:  Procedure Laterality Date   FLEXIBLE SIGMOIDOSCOPY N/A 12/05/2020   Procedure: FLEXIBLE SIGMOIDOSCOPY;  Surgeon: Wyline Mood, MD;  Location: [facility] ENDOSCOPY;  Service: Gastroenterology;  Laterality: N/A;   MOUTH SURGERY     WISDOM TOOTH EXTRACTION      Current Outpatient Medications on File Prior to Visit  Medication Sig Dispense Refill   albuterol (VENTOLIN HFA) 108 (90 Base) MCG/ACT inhaler Inhale 2 puffs into the lungs every 6 (six) hours as needed for wheezing or shortness of breath. 18 g 0   ALPRAZolam (XANAX) 0.25 MG tablet Take 0.25 mg by mouth daily as needed.     atorvastatin (LIPITOR) 10 MG tablet Take by mouth.     azithromycin  (ZITHROMAX) 250 MG tablet Take 2 tablets on day 1, then 1 tablet daily on days 2 through 5 6 tablet 0   cetirizine (ZYRTEC) 10 MG tablet Take 1 tablet (10 mg total) by mouth daily. 90 tablet 3   cholecalciferol (D-VI-SOL) 10 MCG/ML LIQD Vitamin D     CLOBEX 0.05 % shampoo Shampoo into scalp let sit 10 minutes then wash out. Use 2 times per week. 118 mL 11   ketoconazole (NIZORAL) 2 % shampoo Shampoo into scalp let sit 10 minutes then wash out. Use twice per week. 120 mL 11   mometasone (ELOCON) 0.1 % lotion Apply topically daily. Apply to scalp once a day at bedtime for 2 weeks, then decrease to 5 nights a week PRN flares 60 mL 3   Omega-3 Fatty Acids (FISH OIL PO) Take by mouth.     predniSONE (DELTASONE) 20 MG tablet Take 1 tablet (20 mg total) by mouth 2 (two) times daily with a meal for 5 days. 10 tablet 0   Probiotic Product (PROBIOTIC DAILY PO) Take by mouth.     No current facility-administered medications on file prior to visit.    Allergies  Allergen Reactions   Depo-Provera [Medroxyprogesterone] Other (See Comments)    Severe migraine and muscle spasms    Social History:  reports that she has  never smoked. She has never used smokeless tobacco. She reports current alcohol use of about 14.0 standard drinks of alcohol per week. She reports that she does not use drugs.  Family History  Problem Relation Age of Onset   Hyperlipidemia Father    Breast cancer Neg Hx    Colon cancer Neg Hx    Heart disease Neg Hx     The following portions of the patient's history were reviewed and updated as appropriate: allergies, current medications, past family history, past medical history, past social history, past surgical history and problem list.  Review of Systems Pertinent items noted in HPI and remainder of comprehensive ROS otherwise negative.  Physical Exam:  BP 120/82   Pulse 62   Ht  (1.549 m)   Wt 122 lb (55.3 kg)   LMP 09/09/2022   BMI 23.05 kg/m  CONSTITUTIONAL:  Well-developed, well-nourished female in no acute distress.  HENT:  Normocephalic, atraumatic, External right and left ear normal.  EYES: Conjunctivae and EOM are normal. Pupils are equal, round, and reactive to light. No scleral icterus.  NECK: Normal range of motion, supple, no masses.  Normal thyroid.  SKIN: Skin is warm and dry. No rash noted. Not diaphoretic. No erythema. No pallor. MUSCULOSKELETAL: Normal range of motion. No tenderness.  No cyanosis, clubbing, or edema. NEUROLOGIC: Alert and oriented to person, place, and time. Normal reflexes, muscle tone coordination.  PSYCHIATRIC: Normal mood and affect. Normal behavior. Normal judgment and thought content. CARDIOVASCULAR: Normal heart rate noted, regular rhythm RESPIRATORY: Clear to auscultation bilaterally. Effort and breath sounds normal, no problems with respiration noted. BREASTS: Symmetric in size. No masses, tenderness, skin changes, nipple drainage, or lymphadenopathy bilaterally. Performed in the presence of a chaperone. ABDOMEN: Soft, no distention noted.  No tenderness, rebound or guarding.  PELVIC: Normal appearing external genitalia and urethral meatus; normal appearing vaginal mucosa and cervix. Small external hemorrhoid noted.  No abnormal vaginal discharge noted.  Pap smear obtained.  Normal uterine size, no other palpable masses, no uterine or adnexal tenderness.  Performed in the presence of a chaperone.   Assessment and Plan:     1. Hemorrhoids, unspecified hemorrhoid type Referred to [facility] for possible resection of hemorrhoid. - Ambulatory referral to General Surgery  2. Well woman exam with routine gynecological exam - Cytology - PAP Will follow up results of pap smear and manage accordingly. Routine preventative health maintenance measures emphasized. Please refer to After Visit Summary for other counseling recommendations.      Jaynie Collins, MD, FACOG Obstetrician & Gynecologist, [facility] for [employer], [facility]

## 2022-09-24 LAB — CYTOLOGY - PAP
Comment: NEGATIVE
Diagnosis: NEGATIVE
High risk HPV: NEGATIVE

## 2022-09-27 ENCOUNTER — Encounter: Payer: Self-pay | Admitting: Surgery

## 2022-09-27 ENCOUNTER — Ambulatory Visit (INDEPENDENT_AMBULATORY_CARE_PROVIDER_SITE_OTHER): Payer: Medicaid Other | Admitting: Surgery

## 2022-09-27 VITALS — BP 104/73 | HR 96 | Temp 98.0°F | Ht 61.5 in | Wt 121.0 lb

## 2022-09-27 DIAGNOSIS — K602 Anal fissure, unspecified: Secondary | ICD-10-CM

## 2022-09-27 DIAGNOSIS — K648 Other hemorrhoids: Secondary | ICD-10-CM

## 2022-09-27 DIAGNOSIS — K644 Residual hemorrhoidal skin tags: Secondary | ICD-10-CM

## 2022-09-27 NOTE — Progress Notes (Unsigned)
09/27/2022  Reason for Visit:  internal and external hemorrhoids  Requesting Provider:  Jaynie Collins, MD  History of Present Illness: Julia Lyons is a 32 y.o. female presenting for evaluation of hemorrhoids.  The patient reports that she's had issues with hemorrhoids since about 8 years after her first pregnancy.  She feels there is protruding during bowel movements that reduces on its own.  She has a history of prior banding of internal hemorrhoids with Dr. Allegra Lai.  She's had issues with constipation on and off in the past, recently had an episode of constipation, and does not have a bowel movement daily, but otherwise her stool is soft for the most part.  Denies any blood in her stool, but does report some pain in the perianal area.    Past Medical History: Past Medical History:  Diagnosis Date   Allergy    Asthma    Chronic constipation    Cough 06/07/2008   Qualifier: Diagnosis of  By: Ermalene Searing MD, Amy     Hemorrhoid    History of chlamydia 06/2011   treated with doxy   Pregnancy 11/17/2017   Superficial thrombophlebitis 09/2015   right LE     Past Surgical History: Past Surgical History:  Procedure Laterality Date   FLEXIBLE SIGMOIDOSCOPY N/A 12/05/2020   Procedure: FLEXIBLE SIGMOIDOSCOPY;  Surgeon: Wyline Mood, MD;  Location: [facility] ENDOSCOPY;  Service: Gastroenterology;  Laterality: N/A;   MOUTH SURGERY     WISDOM TOOTH EXTRACTION      Home Medications: Prior to Admission medications   Medication Sig Start Date End Date Taking? Authorizing Provider  albuterol (VENTOLIN HFA) 108 (90 Base) MCG/ACT inhaler Inhale 2 puffs into the lungs every 6 (six) hours as needed for wheezing or shortness of breath. 04/30/21  Yes Wallis Bamberg, PA-C  ALPRAZolam Prudy Feeler) 0.25 MG tablet Take 0.25 mg by mouth daily as needed. 10/10/20  Yes [provider]  cetirizine (ZYRTEC) 10 MG tablet Take 1 tablet (10 mg total) by mouth daily. 03/11/18  Yes Kuneff, Renee A, DO  cholecalciferol  (D-VI-SOL) 10 MCG/ML LIQD Vitamin D   Yes [provider]  CLOBEX 0.05 % shampoo Shampoo into scalp let sit 10 minutes then wash out. Use 2 times per week. 06/20/22  Yes Deirdre Evener, MD  ketoconazole (NIZORAL) 2 % shampoo Shampoo into scalp let sit 10 minutes then wash out. Use twice per week. 06/20/22  Yes Deirdre Evener, MD  mometasone (ELOCON) 0.1 % lotion Apply topically daily. Apply to scalp once a day at bedtime for 2 weeks, then decrease to 5 nights a week PRN flares 06/20/22  Yes Deirdre Evener, MD  Omega-3 Fatty Acids (FISH OIL PO) Take by mouth.   Yes [provider]  Probiotic Product (PROBIOTIC DAILY PO) Take by mouth.   Yes [provider]    Allergies: Allergies  Allergen Reactions   Depo-Provera [Medroxyprogesterone] Other (See Comments)    Severe migraine and muscle spasms    Social History:  reports that she has never smoked. She has never been exposed to tobacco smoke. She has never used smokeless tobacco. She reports current alcohol use of about 14.0 standard drinks of alcohol per week. She reports that she does not use drugs.   Family History: Family History  Problem Relation Age of Onset   Hyperlipidemia Father    Breast cancer Neg Hx    Colon cancer Neg Hx    Heart disease Neg Hx     Review of Systems:  Review of Systems  Constitutional:  Negative for chills and fever.  Respiratory:  Negative for shortness of breath.   Cardiovascular:  Negative for chest pain.  Gastrointestinal:  Positive for constipation. Negative for blood in stool, nausea and vomiting.  Skin:  Negative for rash.    Physical Exam BP 104/73   Pulse 96   Temp 98 F (36.7 C)   Ht 5' 1.5" (1.562 m)   Wt 121 lb (54.9 kg)   LMP 09/09/2022   SpO2 100%   BMI 22.49 kg/m  CONSTITUTIONAL: No acute distress, well nourished. HEENT:  Normocephalic, atraumatic, extraocular motion intact. RESPIRATORY:  Normal respiratory effort without pathologic use of  accessory muscles. CARDIOVASCULAR: Regular rhythm and rate. RECTAL: External exam reveals very mildly enlarged anterior external hemorrhoid tissue consistent with a residual anal skin tag.  Posteriorly, the patient does have a small anal fissure which is a little bit tender to palpation.  Digital rectal exam does not reveal a tight sphincter tone and does show some enlarged internal hemorrhoid tissues in the posterior aspect.  No gross blood. MUSCULOSKELETAL:  Normal muscle strength and tone in all four extremities.  No peripheral edema or cyanosis. NEUROLOGIC:  Motor and sensation is grossly normal.  Cranial nerves are grossly intact. PSYCH:  Alert and oriented to person, place and time. Affect is normal.  Laboratory Analysis: No results found for this or any previous visit (from the past 24 hour(s)).  Imaging: No results found.  Assessment and Plan: This is a 32 y.o. female with posterior anal fissure, anal skin tag and internal hemorrhoids  --Discussed with the patient the findings on her exam today, showing a small anal skin tag, some mildly enlarged internal hemorrhoids, but also a small posterior anal fissure.  I think that's the source of her pain at this point.  The anal skin tag was not inflamed, and her sphincter muscle was not tight.  Discussed with her that we can try conservative management of her anal fissure.  Given that it's small and there is no tightness of the sphincter muscle, I think it's reasonable to try working on her constipation first, including adding MiraLax and fiber to her diet to make her more regular.  If this alone does not help heal the anal fissure, then we would add nifedipine ointment.  Discussed with the patient that we can in the future offer her surgery for her hemorrhoids, but unfortunately pregnancy is a risk factor for hemorrhoids to worsen.  She is thinking of being pregnant in the future, so perhaps would be better to wait until after that to do any  surgical management of her hemorrhoids and skin tag.  For now, we'll focus on the anal fissure. --Follow up in 3-4 weeks to assess the anal fissure.  All of her questions have been answered.  I spent 30 minutes dedicated to the care of this patient on the date of this encounter to include pre-visit review of records, face-to-face time with the patient discussing diagnosis and management, and any post-visit coordination of care.   Howie Ill, MD [facility]

## 2022-09-27 NOTE — Patient Instructions (Signed)
Advised to pursue a goal of 25 to 30 g of fiber daily.  Made aware that the majority of this may be through natural sources, but advised to be aware of actual consumption and to ensure minimal consumption by daily supplementation. Recommended Psyllium husk, that mixes well with applesauce, or the powder which goes down well shaken with chocolate milk.  Strongly advised to consume more fluids to ensure adequate hydration, instructed to watch color of urine to determine adequacy of hydration.  Clarity is pursued in urine output, and bowel activity that correlates to significant meal intake.   We need to avoid deferring having bowel movements, advised to take the time at the first sign of sensation, typically following meals, and in the morning.   You may take Miralax once a day to increase the frequency of bowel movements. You may adjust this as needed.  To be regular, we must do the above EVERY day.   High-Fiber Eating Plan Fiber, also called dietary fiber, is a type of carbohydrate. It is found foods such as fruits, vegetables, whole grains, and beans. A high-fiber diet can have many health benefits. Your health care provider may recommend a high-fiber diet to help: Prevent constipation. Fiber can make your bowel movements more regular. Lower your cholesterol. Relieve the following conditions: Inflammation of veins in the anus (hemorrhoids). Inflammation of specific areas of the digestive tract (uncomplicated diverticulosis). A problem of the large intestine, also called the colon, that sometimes causes pain and diarrhea (irritable bowel syndrome, or IBS). Prevent overeating as part of a weight-loss plan. Prevent heart disease, type 2 diabetes, and certain cancers. What are tips for following this plan? Reading food labels  Check the nutrition facts label on food products for the amount of dietary fiber. Choose foods that have 5 grams of fiber or more per serving. The goals for recommended daily  fiber intake include: Men (age 35 or younger): 34-38 g. Men (over age 57): 28-34 g. Women (age 60 or younger): 25-28 g. Women (over age 88): 22-25 g. Your daily fiber goal is 25-30g. Shopping Choose whole fruits and vegetables instead of processed forms, such as apple juice or applesauce. Choose a wide variety of high-fiber foods such as avocados, lentils, oats, and kidney beans. Read the nutrition facts label of the foods you choose. Be aware of foods with added fiber. These foods often have high sugar and sodium amounts per serving. Cooking Use whole-grain flour for baking and cooking. Cook with brown rice instead of white rice. Meal planning Start the day with a breakfast that is high in fiber, such as a cereal that contains 5 g of fiber or more per serving. Eat breads and cereals that are made with whole-grain flour instead of refined flour or white flour. Eat brown rice, bulgur wheat, or millet instead of white rice. Use beans in place of meat in soups, salads, and pasta dishes. Be sure that half of the grains you eat each day are whole grains. General information You can get the recommended daily intake of dietary fiber by: Eating a variety of fruits, vegetables, grains, nuts, and beans. Taking a fiber supplement if you are not able to take in enough fiber in your diet. It is better to get fiber through food than from a supplement. Gradually increase how much fiber you consume. If you increase your intake of dietary fiber too quickly, you may have bloating, cramping, or gas. Drink plenty of water to hep you digest fiber. Choose high-fiber snacks,  such as berries, raw vegetables, nuts, and popcorn. What foods should I eat? Fruits Berries. Pears. Apples. Oranges. Avocado. Prunes and raisins. Dried figs. Vegetables Sweet potatoes. Spinach. Kale. Artichokes. Cabbage. Broccoli. Cauliflower. Green peas. Carrots. Squash. Grains Whole-grain breads. Multigrain cereal. Oats and oatmeal.  Brown rice. Barley. Bulgur wheat. Millet. Quinoa. Bran muffins. Popcorn. Rye wafer crackers. Meats and other proteins Navy beans, kidney beans, and pinto beans. Soybeans. Split peas. Lentils. Nuts and seeds. Dairy Fiber-fortified yogurt. Beverages Fiber-fortified soy milk. Fiber-fortified orange juice. Other foods Fiber bars. The items listed above may not be a complete list of recommended foods and beverages. Contact a dietitian for more information. What foods should I avoid? Fruits Fruit juice. Cooked, strained fruit. Vegetables Fried potatoes. Canned vegetables. Well-cooked vegetables. Grains White bread. Pasta made with refined flour. White rice. Meats and other proteins Fatty cuts of meat. Fried chicken or fried fish. Dairy Milk. Yogurt. Cream cheese. Sour cream. Fats and oils Butters. Beverages Soft drinks. Other foods Cakes and pastries. The items listed above may not be a complete list of foods and beverages to avoid. Talk with your dietitian about what choices are best for you. Summary Fiber is a type of carbohydrate. It is found in foods such as fruits, vegetables, whole grains, and beans. A high-fiber diet has many benefits. It can help to prevent constipation, lower blood cholesterol, aid weight loss, and reduce your risk of heart disease, diabetes, and certain cancers. Increase your intake of fiber gradually. Increasing fiber too quickly may cause cramping, bloating, and gas. Drink plenty of water while you increase the amount of fiber you consume. The best sources of fiber include whole fruits and vegetables, whole grains, nuts, seeds, and beans. This information is not intended to replace advice given to you by your health care provider. Make sure you discuss any questions you have with your health care provider. Document Revised: 09/16/2019 Document Reviewed: 09/16/2019 Elsevier Patient Education  2023 [employer].

## 2022-10-01 ENCOUNTER — Telehealth: Payer: Medicaid Other | Admitting: Nurse Practitioner

## 2022-10-01 DIAGNOSIS — J0140 Acute pansinusitis, unspecified: Secondary | ICD-10-CM

## 2022-10-01 MED ORDER — AMOXICILLIN-POT CLAVULANATE 875-125 MG PO TABS: 1.0000 | ORAL_TABLET | Freq: Two times a day (BID) | ORAL | 0 refills | Status: AC

## 2022-10-01 NOTE — Progress Notes (Signed)
Virtual Visit Consent   Hollice Maffucci, you are scheduled for a virtual visit with a [facility] provider today. Just as with appointments in the office, your consent must be obtained to participate. Your consent will be active for this visit and any virtual visit you may have with one of our providers in the next 365 days. If you have a MyChart account, a copy of this consent can be sent to you electronically.  As this is a virtual visit, video technology does not allow for your provider to perform a traditional examination. This may limit your provider's ability to fully assess your condition. If your provider identifies any concerns that need to be evaluated in person or the need to arrange testing (such as labs, EKG, etc.), we will make arrangements to do so. Although advances in technology are sophisticated, we cannot ensure that it will always work on either your end or our end. If the connection with a video visit is poor, the visit may have to be switched to a telephone visit. With either a video or telephone visit, we are not always able to ensure that we have a secure connection.  By engaging in this virtual visit, you consent to the provision of healthcare and authorize for your insurance to be billed (if applicable) for the services provided during this visit. Depending on your insurance coverage, you may receive a charge related to this service.  I need to obtain your verbal consent now. Are you willing to proceed with your visit today? Julia Lyons has provided verbal consent on 10/01/2022 for a virtual visit (video or telephone). Viviano Simas, FNP  Date: 10/01/2022 7:44 AM  Virtual Visit via Video Note   I, Viviano Simas, connected with  Julia Lyons  ([id number], 03-12-1991) on 10/01/22 at  7:45 AM EDT by a video-enabled telemedicine application and verified that I am speaking with the correct person using two identifiers.  Location: Patient: Virtual Visit Location  Patient: Home Provider: Virtual Visit Location Provider: Home Office   I discussed the limitations of evaluation and management by telemedicine and the availability of in person appointments. The patient expressed understanding and agreed to proceed.    History of Present Illness: Julia Lyons is a 32 y.o. who identifies as a female who was assigned female at birth, and is being seen today for recurrent cough and congestion.  She was seen 09/18/22 and treated for bronchitis with prednisone and azithromycin She did recover from that for the most part  Has had some ongoing ear pain that has been consistent with pressure  She has maintained a small cough no productive  She was working outdoors this weekend  She has continued to use her Albuterol inhaler twice a day on average  She continues to use Zyrtec daily  She does use Flonase daily   For the past 24 hours she has had a return of her productive cough, a sore throat and nasal congestion   She has tried throat lozenges, hot tea  Denies any body aches, fever/chills    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   Blood type O+ 07/21/2017   Hemorrhoids 12/04/2016   Seasonal allergic rhinitis due to pollen 09/27/2016   Exercise-induced asthma 10/22/2011    Allergies:  Allergies  Allergen Reactions   Depo-Provera [Medroxyprogesterone] Other (See Comments)    Severe migraine and muscle spasms  Medications:  Current Outpatient Medications:    albuterol (VENTOLIN HFA) 108 (90 Base) MCG/ACT inhaler, Inhale 2 puffs into the lungs every 6 (six) hours as needed for wheezing or shortness of breath., Disp: 18 g, Rfl: 0   ALPRAZolam (XANAX) 0.25 MG tablet, Take 0.25 mg by mouth daily as needed., Disp: , Rfl:    cetirizine (ZYRTEC) 10 MG tablet, Take 1 tablet (10 mg total) by mouth daily., Disp: 90 tablet, Rfl: 3    cholecalciferol (D-VI-SOL) 10 MCG/ML LIQD, Vitamin D, Disp: , Rfl:    CLOBEX 0.05 % shampoo, Shampoo into scalp let sit 10 minutes then wash out. Use 2 times per week., Disp: 118 mL, Rfl: 11   ketoconazole (NIZORAL) 2 % shampoo, Shampoo into scalp let sit 10 minutes then wash out. Use twice per week., Disp: 120 mL, Rfl: 11   mometasone (ELOCON) 0.1 % lotion, Apply topically daily. Apply to scalp once a day at bedtime for 2 weeks, then decrease to 5 nights a week PRN flares, Disp: 60 mL, Rfl: 3   Omega-3 Fatty Acids (FISH OIL PO), Take by mouth., Disp: , Rfl:    Probiotic Product (PROBIOTIC DAILY PO), Take by mouth., Disp: , Rfl:   Observations/Objective: Patient is well-developed, well-nourished in no acute distress.  Resting comfortably  at home.  Head is normocephalic, atraumatic.  No labored breathing.  Speech is clear and coherent with logical content.  Patient is alert and oriented at baseline.    Assessment and Plan:  Discussed rebound symptoms and advised pushing fluids, Mucinex, increasing Albuterol to every 4-6 hours and rest for the next 24-48 hours. Advil/ibuprofen for inflammation relief and ear pain. Continue allergy regimen.  If symptoms persist or worsen she will move forward with antibiotics as discussed   1. Acute non-recurrent pansinusitis  - amoxicillin-clavulanate (AUGMENTIN) 875-125 MG tablet; Take 1 tablet by mouth 2 (two) times daily for 7 days. Take with food  Dispense: 14 tablet; Refill: 0      Follow Up Instructions: I discussed the assessment and treatment plan with the patient. The patient was provided an opportunity to ask questions and all were answered. The patient agreed with the plan and demonstrated an understanding of the instructions.  A copy of instructions were sent to the patient via MyChart unless otherwise noted below.    The patient was advised to call back or seek an in-person evaluation if the symptoms worsen or if the condition fails to  improve as anticipated.  Time:  I spent 15 minutes with the patient via telehealth technology discussing the above problems/concerns.    Viviano Simas, FNP

## 2022-10-10 DIAGNOSIS — Z79899 Other long term (current) drug therapy: Secondary | ICD-10-CM | POA: Diagnosis not present

## 2022-10-10 DIAGNOSIS — G4700 Insomnia, unspecified: Secondary | ICD-10-CM | POA: Diagnosis not present

## 2022-10-10 DIAGNOSIS — F419 Anxiety disorder, unspecified: Secondary | ICD-10-CM | POA: Diagnosis not present

## 2022-10-17 ENCOUNTER — Other Ambulatory Visit (INDEPENDENT_AMBULATORY_CARE_PROVIDER_SITE_OTHER): Payer: Medicaid Other | Admitting: Vascular Surgery

## 2022-10-23 ENCOUNTER — Ambulatory Visit: Payer: Medicaid Other | Admitting: Surgery

## 2022-10-24 ENCOUNTER — Encounter (INDEPENDENT_AMBULATORY_CARE_PROVIDER_SITE_OTHER): Payer: Medicaid Other

## 2022-10-25 ENCOUNTER — Ambulatory Visit: Payer: Medicaid Other | Admitting: Surgery

## 2022-11-08 ENCOUNTER — Ambulatory Visit (INDEPENDENT_AMBULATORY_CARE_PROVIDER_SITE_OTHER): Payer: Medicaid Other | Admitting: Nurse Practitioner

## 2022-11-20 ENCOUNTER — Ambulatory Visit: Payer: Medicaid Other | Admitting: Surgery

## 2022-11-20 NOTE — Progress Notes (Signed)
    MRN : [id number]  Khayla Vorbeck is a 32 y.o. ([birth year]) female who presents with chief complaint of painful varicose veins.    The patient's right lower extremity was sterilely prepped and draped.  The ultrasound machine was used to visualize the right great saphenous vein throughout its course.  A segment at the knee was selected for access.  The saphenous vein was accessed without difficulty using ultrasound guidance with a micropuncture needle.   An 0.018  wire was placed beyond the saphenofemoral junction through the sheath and the microneedle was removed.  The 65 cm sheath was then placed over the wire and the wire and dilator were removed.  The laser fiber was placed through the sheath and its tip was placed approximately 2 cm below the saphenofemoral junction.  Tumescent anesthesia was then created with a dilute lidocaine solution.  Laser energy was then delivered with constant withdrawal of the sheath and laser fiber.  Approximately 1350 Joules of energy were delivered over a length of 22 cm.  Sterile dressings were placed.  The patient tolerated the procedure well without complications.

## 2022-11-21 ENCOUNTER — Encounter (INDEPENDENT_AMBULATORY_CARE_PROVIDER_SITE_OTHER): Payer: Self-pay | Admitting: Vascular Surgery

## 2022-11-21 ENCOUNTER — Ambulatory Visit (INDEPENDENT_AMBULATORY_CARE_PROVIDER_SITE_OTHER): Payer: Medicaid Other | Admitting: Vascular Surgery

## 2022-11-21 VITALS — BP 111/73 | HR 74 | Resp 16 | Ht 61.5 in | Wt 123.0 lb

## 2022-11-21 DIAGNOSIS — I8310 Varicose veins of unspecified lower extremity with inflammation: Secondary | ICD-10-CM

## 2022-11-21 DIAGNOSIS — I8311 Varicose veins of right lower extremity with inflammation: Secondary | ICD-10-CM | POA: Diagnosis not present

## 2022-11-22 ENCOUNTER — Other Ambulatory Visit (INDEPENDENT_AMBULATORY_CARE_PROVIDER_SITE_OTHER): Payer: Self-pay | Admitting: Vascular Surgery

## 2022-11-22 DIAGNOSIS — I8310 Varicose veins of unspecified lower extremity with inflammation: Secondary | ICD-10-CM

## 2022-11-24 ENCOUNTER — Encounter (INDEPENDENT_AMBULATORY_CARE_PROVIDER_SITE_OTHER): Payer: Self-pay | Admitting: Vascular Surgery

## 2022-11-27 ENCOUNTER — Ambulatory Visit (INDEPENDENT_AMBULATORY_CARE_PROVIDER_SITE_OTHER): Payer: Medicaid Other

## 2022-11-27 DIAGNOSIS — I8311 Varicose veins of right lower extremity with inflammation: Secondary | ICD-10-CM

## 2022-11-27 DIAGNOSIS — I8310 Varicose veins of unspecified lower extremity with inflammation: Secondary | ICD-10-CM

## 2022-12-02 ENCOUNTER — Telehealth: Payer: Medicaid Other | Admitting: Physician Assistant

## 2022-12-02 DIAGNOSIS — R3989 Other symptoms and signs involving the genitourinary system: Secondary | ICD-10-CM

## 2022-12-02 MED ORDER — NITROFURANTOIN MONOHYD MACRO 100 MG PO CAPS: 100.0000 mg | ORAL_CAPSULE | Freq: Two times a day (BID) | ORAL | 0 refills | Status: DC

## 2022-12-02 NOTE — Patient Instructions (Signed)
Julia Lyons, thank you for joining [name], PA-C for today's virtual visit.  While this provider is not your primary care provider (PCP), if your PCP is located in our provider database this encounter information will be shared with them immediately following your visit.   A [facility] MyChart account gives you access to today's visit and all your visits, tests, and labs performed at [facility] " click here if you don't have a [facility] MyChart account or go to [email or web address]  Consent: (Patient) Julia Lyons provided verbal consent for this virtual visit at the beginning of the encounter.  Current Medications:  Current Outpatient Medications:    albuterol (VENTOLIN HFA) 108 (90 Base) MCG/ACT inhaler, Inhale 2 puffs into the lungs every 6 (six) hours as needed for wheezing or shortness of breath., Disp: 18 g, Rfl: 0   ALPRAZolam (XANAX) 0.25 MG tablet, Take 0.25 mg by mouth daily as needed., Disp: , Rfl:    cetirizine (ZYRTEC) 10 MG tablet, Take 1 tablet (10 mg total) by mouth daily., Disp: 90 tablet, Rfl: 3   cholecalciferol (D-VI-SOL) 10 MCG/ML LIQD, Vitamin D, Disp: , Rfl:    CLOBEX 0.05 % shampoo, Shampoo into scalp let sit 10 minutes then wash out. Use 2 times per week., Disp: 118 mL, Rfl: 11   ketoconazole (NIZORAL) 2 % shampoo, Shampoo into scalp let sit 10 minutes then wash out. Use twice per week., Disp: 120 mL, Rfl: 11   mometasone (ELOCON) 0.1 % lotion, Apply topically daily. Apply to scalp once a day at bedtime for 2 weeks, then decrease to 5 nights a week PRN flares, Disp: 60 mL, Rfl: 3   nitrofurantoin, macrocrystal-monohydrate, (MACROBID) 100 MG capsule, Take 1 capsule (100 mg total) by mouth 2 (two) times daily., Disp: 10 capsule, Rfl: 0   Omega-3 Fatty Acids (FISH OIL PO), Take by mouth., Disp: , Rfl:    Probiotic Product (PROBIOTIC DAILY PO), Take by mouth., Disp: , Rfl:    Medications ordered in this encounter:  Meds  ordered this encounter  Medications   nitrofurantoin, macrocrystal-monohydrate, (MACROBID) 100 MG capsule    Sig: Take 1 capsule (100 mg total) by mouth 2 (two) times daily.    Dispense:  10 capsule    Refill:  0    Order Specific Question:   Supervising Provider    Answer:   Merrilee Jansky [id number]     *If you need refills on other medications prior to your next appointment, please contact your pharmacy*  Follow-Up: Call back or seek an in-person evaluation if the symptoms worsen or if the condition fails to improve as anticipated.  [facility] [phone]  Other Instructions  Urinary Tract Infection, Adult  A urinary tract infection (UTI) is an infection of any part of the urinary tract. The urinary tract includes the kidneys, ureters, bladder, and urethra. These organs make, store, and get rid of urine in the body. An upper UTI affects the ureters and kidneys. A lower UTI affects the bladder and urethra. What are the causes? Most urinary tract infections are caused by bacteria in your genital area around your urethra, where urine leaves your body. These bacteria grow and cause inflammation of your urinary tract. What increases the risk? You are more likely to develop this condition if: You have a urinary catheter that stays in place. You are not able to control when you urinate or have a bowel movement (incontinence). You are female and you: Use a  spermicide or diaphragm for birth control. Have low estrogen levels. Are pregnant. You have certain genes that increase your risk. You are sexually active. You take antibiotic medicines. You have a condition that causes your flow of urine to slow down, such as: An enlarged prostate, if you are female. Blockage in your urethra. A kidney stone. A nerve condition that affects your bladder control (neurogenic bladder). Not getting enough to drink, or not urinating often. You have certain medical conditions, such  as: Diabetes. A weak disease-fighting system (immunesystem). Sickle cell disease. Gout. Spinal cord injury. What are the signs or symptoms? Symptoms of this condition include: Needing to urinate right away (urgency). Frequent urination. This may include small amounts of urine each time you urinate. Pain or burning with urination. Blood in the urine. Urine that smells bad or unusual. Trouble urinating. Cloudy urine. Vaginal discharge, if you are female. Pain in the abdomen or the lower back. You may also have: Vomiting or a decreased appetite. Confusion. Irritability or tiredness. A fever or chills. Diarrhea. The first symptom in older adults may be confusion. In some cases, they may not have any symptoms until the infection has worsened. How is this diagnosed? This condition is diagnosed based on your medical history and a physical exam. You may also have other tests, including: Urine tests. Blood tests. Tests for STIs (sexually transmitted infections). If you have had more than one UTI, a cystoscopy or imaging studies may be done to determine the cause of the infections. How is this treated? Treatment for this condition includes: Antibiotic medicine. Over-the-counter medicines to treat discomfort. Drinking enough water to stay hydrated. If you have frequent infections or have other conditions such as a kidney stone, you may need to see a health care provider who specializes in the urinary tract (urologist). In rare cases, urinary tract infections can cause sepsis. Sepsis is a life-threatening condition that occurs when the body responds to an infection. Sepsis is treated in the hospital with IV antibiotics, fluids, and other medicines. Follow these instructions at home:  Medicines Take over-the-counter and prescription medicines only as told by your health care provider. If you were prescribed an antibiotic medicine, take it as told by your health care provider. Do not stop  using the antibiotic even if you start to feel better. General instructions Make sure you: Empty your bladder often and completely. Do not hold urine for long periods of time. Empty your bladder after sex. Wipe from front to back after urinating or having a bowel movement if you are female. Use each tissue only one time when you wipe. Drink enough fluid to keep your urine pale yellow. Keep all follow-up visits. This is important. Contact a health care provider if: Your symptoms do not get better after 1-2 days. Your symptoms go away and then return. Get help right away if: You have severe pain in your back or your lower abdomen. You have a fever or chills. You have nausea or vomiting. Summary A urinary tract infection (UTI) is an infection of any part of the urinary tract, which includes the kidneys, ureters, bladder, and urethra. Most urinary tract infections are caused by bacteria in your genital area. Treatment for this condition often includes antibiotic medicines. If you were prescribed an antibiotic medicine, take it as told by your health care provider. Do not stop using the antibiotic even if you start to feel better. Keep all follow-up visits. This is important. This information is not intended to replace advice given  to you by your health care provider. Make sure you discuss any questions you have with your health care provider. Document Revised: 12/19/2019 Document Reviewed: 12/24/2019 Elsevier Patient Education  2024 Elsevier Inc.    If you have been instructed to have an in-person evaluation today at a local Urgent Care facility, please use the link below. It will take you to a list of all of our available [facility], including address, phone number and hours of operation. Please do not delay care.  [facility]  If you or a family member do not have a primary care provider, use the link below to schedule a visit and establish care. When you  choose a [facility] primary care physician or advanced practice provider, you gain a long-term partner in health. Find a Primary Care Provider  Learn more about [facility]'s in-office and virtual care options: [facility] - Get Care Now

## 2022-12-02 NOTE — Progress Notes (Signed)
Virtual Visit Consent   Julia Lyons, you are scheduled for a virtual visit with a  provider today. Just as with appointments in the office, your consent must be obtained to participate. Your consent will be active for this visit and any virtual visit you may have with one of our providers in the next 365 days. If you have a MyChart account, a copy of this consent can be sent to you electronically.  As this is a virtual visit, video technology does not allow for your provider to perform a traditional examination. This may limit your provider's ability to fully assess your condition. If your provider identifies any concerns that need to be evaluated in person or the need to arrange testing (such as labs, EKG, etc.), we will make arrangements to do so. Although advances in technology are sophisticated, we cannot ensure that it will always work on either your end or our end. If the connection with a video visit is poor, the visit may have to be switched to a telephone visit. With either a video or telephone visit, we are not always able to ensure that we have a secure connection.  By engaging in this virtual visit, you consent to the provision of healthcare and authorize for your insurance to be billed (if applicable) for the services provided during this visit. Depending on your insurance coverage, you may receive a charge related to this service.  I need to obtain your verbal consent now. Are you willing to proceed with your visit today? Dhalia Seawell-Oxton has provided verbal consent on 12/02/2022 for a virtual visit (video or telephone). [name], PA-C  Date: 12/02/2022 9:52 AM  Virtual Visit via Video Note   I, [name], connected with  Julia Lyons  ([id number], 1990-10-23) on 12/02/22 at  9:45 AM EDT by a video-enabled telemedicine application and verified that I am speaking with the correct person using two identifiers.  Location: Patient: Virtual Visit  Location Patient: Home Provider: Virtual Visit Location Provider: Home Office   I discussed the limitations of evaluation and management by telemedicine and the availability of in person appointments. The patient expressed understanding and agreed to proceed.    History of Present Illness: Julia Lyons is a 32 y.o. who identifies as a female who was assigned female at birth, and is being seen today for possible UTI.  HPI: Urinary Tract Infection  This is a new problem. The current episode started in the past 7 days (Saturday). The problem occurs every urination. The problem has been gradually worsening. The quality of the pain is described as aching. The pain is mild. There has been no fever. Associated symptoms include chills (last night), frequency, hematuria, hesitancy, nausea (yesterday morning) and urgency. Pertinent negatives include no discharge, flank pain, sweats or vomiting. Associated symptoms comments: One episode of urinary incontinence, low back pain, headache this morning, right lower quadrant pain. She has tried increased fluids for the symptoms. The treatment provided no relief. There is no history of recurrent UTIs.     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   Blood type O+ 07/21/2017   Hemorrhoids 12/04/2016   Seasonal allergic rhinitis due to pollen 09/27/2016   Exercise-induced asthma 10/22/2011    Allergies:  Allergies  Allergen Reactions   Depo-Provera [Medroxyprogesterone] Other (See Comments)    Severe migraine and muscle spasms   Medications:  Current Outpatient Medications:    albuterol (VENTOLIN HFA) 108 (90 Base) MCG/ACT inhaler, Inhale 2 puffs into the lungs every 6 (six) hours as needed for wheezing or shortness of breath., Disp: 18 g, Rfl: 0   ALPRAZolam (XANAX) 0.25 MG tablet, Take 0.25 mg by mouth daily as needed., Disp: , Rfl:     cetirizine (ZYRTEC) 10 MG tablet, Take 1 tablet (10 mg total) by mouth daily., Disp: 90 tablet, Rfl: 3   cholecalciferol (D-VI-SOL) 10 MCG/ML LIQD, Vitamin D, Disp: , Rfl:    CLOBEX 0.05 % shampoo, Shampoo into scalp let sit 10 minutes then wash out. Use 2 times per week., Disp: 118 mL, Rfl: 11   ketoconazole (NIZORAL) 2 % shampoo, Shampoo into scalp let sit 10 minutes then wash out. Use twice per week., Disp: 120 mL, Rfl: 11   mometasone (ELOCON) 0.1 % lotion, Apply topically daily. Apply to scalp once a day at bedtime for 2 weeks, then decrease to 5 nights a week PRN flares, Disp: 60 mL, Rfl: 3   nitrofurantoin, macrocrystal-monohydrate, (MACROBID) 100 MG capsule, Take 1 capsule (100 mg total) by mouth 2 (two) times daily., Disp: 10 capsule, Rfl: 0   Omega-3 Fatty Acids (FISH OIL PO), Take by mouth., Disp: , Rfl:    Probiotic Product (PROBIOTIC DAILY PO), Take by mouth., Disp: , Rfl:   Observations/Objective: Patient is well-developed, well-nourished in no acute distress.  Resting comfortably at home.  Head is normocephalic, atraumatic.  No labored breathing.  Speech is clear and coherent with logical content.  Patient is alert and oriented at baseline.    Assessment and Plan: 1. Suspected UTI - nitrofurantoin, macrocrystal-monohydrate, (MACROBID) 100 MG capsule; Take 1 capsule (100 mg total) by mouth 2 (two) times daily.  Dispense: 10 capsule; Refill: 0  - Worsening symptoms.  - Will treat empirically with Macrobid - May use AZO for bladder spasms - Continue to push fluids.  - Seek in person evaluation for urine culture if symptoms do not improve or if they worsen.    Follow Up Instructions: I discussed the assessment and treatment plan with the patient. The patient was provided an opportunity to ask questions and all were answered. The patient agreed with the plan and demonstrated an understanding of the instructions.  A copy of instructions were sent to the patient via MyChart  unless otherwise noted below.    The patient was advised to call back or seek an in-person evaluation if the symptoms worsen or if the condition fails to improve as anticipated.  Time:  I spent 8 minutes with the patient via telehealth technology discussing the above problems/concerns.    [name], PA-C

## 2022-12-19 ENCOUNTER — Ambulatory Visit (INDEPENDENT_AMBULATORY_CARE_PROVIDER_SITE_OTHER): Payer: Medicaid Other | Admitting: Nurse Practitioner

## 2022-12-27 ENCOUNTER — Ambulatory Visit (INDEPENDENT_AMBULATORY_CARE_PROVIDER_SITE_OTHER): Payer: Medicaid Other | Admitting: Nurse Practitioner

## 2022-12-27 ENCOUNTER — Encounter (INDEPENDENT_AMBULATORY_CARE_PROVIDER_SITE_OTHER): Payer: Self-pay | Admitting: Nurse Practitioner

## 2022-12-27 VITALS — BP 103/71 | HR 77 | Resp 16 | Ht 61.5 in | Wt 124.8 lb

## 2022-12-27 DIAGNOSIS — I8310 Varicose veins of unspecified lower extremity with inflammation: Secondary | ICD-10-CM

## 2022-12-27 NOTE — Progress Notes (Signed)
Subjective:    Patient ID: Julia Lyons, female    DOB: 01-17-1991, 32 y.o.   MRN: [id number] Chief Complaint  Patient presents with   Follow-up    4 week post right leg laser    The patient returns to the office for followup status post laser ablation of the right saphenous vein on 11/21/2022.  The patient note significant improvement in the lower extremity pain but not resolution of the symptoms. The patient notes multiple residual varicosities bilaterally which continued to hurt with dependent positions and remained tender to palpation. The patient's swelling is minimally from preoperative status. The patient continues to wear graduated compression stockings on a daily basis but these are not eliminating the pain and discomfort. The patient continues to use over-the-counter anti-inflammatory medications to treat the pain and related symptoms but this has not given the patient relief. The patient notes the pain in the lower extremities is causing problems with daily exercise, problems at work and even with household activities such as preparing meals and doing dishes.  The patient is otherwise done well and there have been no complications related to the laser procedure or interval changes in the patient's overall   Post laser ultrasound shows successful ablation of the GSV      Review of Systems  Cardiovascular:  Negative for leg swelling.  All other systems reviewed and are negative.      Objective:   Physical Exam Vitals reviewed.  HENT:     Head: Normocephalic.  Cardiovascular:     Rate and Rhythm: Normal rate.     Pulses: Normal pulses.  Pulmonary:     Effort: Pulmonary effort is normal.  Skin:    General: Skin is warm and dry.  Neurological:     Mental Status: She is alert and oriented to person, place, and time.  Psychiatric:        Mood and Affect: Mood normal.        Behavior: Behavior normal.        Thought Content: Thought content normal.         Judgment: Judgment normal.     BP 103/71 (BP Location: Right Arm)   Pulse 77   Resp 16   Ht 5' 1.5" (1.562 m)   Wt 124 lb 12.8 oz (56.6 kg)   BMI 23.20 kg/m   Past Medical History:  Diagnosis Date   Allergy    Asthma    Chronic constipation    Cough 06/07/2008   Qualifier: Diagnosis of  By: Ermalene Searing MD, Amy     Hemorrhoid    History of chlamydia 06/2011   treated with doxy   Pregnancy 11/17/2017   Superficial thrombophlebitis 09/2015   right LE    Social History   Socioeconomic History   Marital status: Single    Spouse name: Not on file   Number of children: 1   Years of education: 13   Highest education level: Not on file  Occupational History   Occupation: Server  Tobacco Use   Smoking status: Never    Passive exposure: Never   Smokeless tobacco: Never  Vaping Use   Vaping status: Never Used  Substance and Sexual Activity   Alcohol use: Yes    Alcohol/week: 14.0 standard drinks of alcohol    Types: 14 Glasses of wine per week    Comment: socially   Drug use: No   Sexual activity: Yes    Partners: Male    Birth control/protection: None  Other Topics Concern   Not on file  Social History Narrative   Single. 1 daughter Hadynn.    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    Drinks caffeine. Takes a daily vitamin.    Wears seatbelt, bicycle helmet. Smoke detector in the home.    Exercises routinely.    Feels safe in her relationships.       Social Determinants of Health   Financial Resource Strain: Not on file  Food Insecurity: Not on file  Transportation Needs: Not on file  Physical Activity: Not on file  Stress: Not on file  Social Connections: Not on file  Intimate Partner Violence: Not on file    Past Surgical History:  Procedure Laterality Date   FLEXIBLE SIGMOIDOSCOPY N/A 12/05/2020   Procedure: FLEXIBLE SIGMOIDOSCOPY;  Surgeon: Wyline Mood, MD;  Location: [facility] ENDOSCOPY;  Service: Gastroenterology;  Laterality: N/A;   MOUTH SURGERY      WISDOM TOOTH EXTRACTION      Family History  Problem Relation Age of Onset   Hyperlipidemia Father    Breast cancer Neg Hx    Colon cancer Neg Hx    Heart disease Neg Hx     Allergies  Allergen Reactions   Depo-Provera [Medroxyprogesterone] Other (See Comments)    Severe migraine and muscle spasms       Latest Ref Rng & Units 11/17/2020   10:46 AM 03/11/2018    1:29 PM 11/18/2017    5:29 AM  CBC  WBC 3.4 - 10.8 x10E3/uL 7.9  7.8  13.3   Hemoglobin 11.1 - 15.9 g/dL 16.1  09.6  04.5   Hematocrit 34.0 - 46.6 % 42.0  42.4  31.9   Platelets 150 - 450 x10E3/uL 279  283.0  309       CMP     Component Value Date/Time   NA 138 03/11/2018 1329   K 4.0 03/11/2018 1329   CL 103 03/11/2018 1329   CO2 29 03/11/2018 1329   GLUCOSE 76 03/11/2018 1329   BUN 16 03/11/2018 1329   CREATININE 0.82 03/11/2018 1329   CALCIUM 9.8 03/11/2018 1329   PROT 7.8 03/11/2018 1329   ALBUMIN 4.7 03/11/2018 1329   AST 21 03/11/2018 1329   ALT 26 03/11/2018 1329   ALKPHOS 138 (H) 03/11/2018 1329   BILITOT 0.7 03/11/2018 1329   GFR 89.72 03/11/2018 1329     No results found.     Assessment & Plan:   1. Varicose veins with inflammation Recommend:  The patient has had successful ablation of the previously incompetent saphenous venous system but still has persistent symptoms of pain and swelling that are having a negative impact on daily life and daily activities.  Patient should undergo injection sclerotherapy to treat the residual varicosities.  The risks, benefits and alternative therapies were reviewed in detail with the patient.  All questions were answered.  The patient agrees to proceed with sclerotherapy at their convenience.  The patient will continue wearing the graduated compression stockings and using the over-the-counter pain medications to treat her symptoms.       Current Outpatient Medications on File Prior to Visit  Medication Sig Dispense Refill   albuterol (VENTOLIN  HFA) 108 (90 Base) MCG/ACT inhaler Inhale 2 puffs into the lungs every 6 (six) hours as needed for wheezing or shortness of breath. 18 g 0   ALPRAZolam (XANAX) 0.25 MG tablet Take 0.25 mg by mouth daily as needed.     cetirizine (ZYRTEC) 10 MG tablet Take  1 tablet (10 mg total) by mouth daily. 90 tablet 3   cholecalciferol (D-VI-SOL) 10 MCG/ML LIQD Vitamin D     CLOBEX 0.05 % shampoo Shampoo into scalp let sit 10 minutes then wash out. Use 2 times per week. 118 mL 11   ketoconazole (NIZORAL) 2 % shampoo Shampoo into scalp let sit 10 minutes then wash out. Use twice per week. 120 mL 11   mometasone (ELOCON) 0.1 % lotion Apply topically daily. Apply to scalp once a day at bedtime for 2 weeks, then decrease to 5 nights a week PRN flares 60 mL 3   nitrofurantoin, macrocrystal-monohydrate, (MACROBID) 100 MG capsule Take 1 capsule (100 mg total) by mouth 2 (two) times daily. 10 capsule 0   Omega-3 Fatty Acids (FISH OIL PO) Take by mouth.     Probiotic Product (PROBIOTIC DAILY PO) Take by mouth.     No current facility-administered medications on file prior to visit.    There are no Patient Instructions on file for this visit. No follow-ups on file.   Georgiana Spinner, NP

## 2023-01-06 ENCOUNTER — Telehealth: Payer: Medicaid Other | Admitting: Physician Assistant

## 2023-01-06 DIAGNOSIS — T3695XA Adverse effect of unspecified systemic antibiotic, initial encounter: Secondary | ICD-10-CM | POA: Diagnosis not present

## 2023-01-06 DIAGNOSIS — B379 Candidiasis, unspecified: Secondary | ICD-10-CM

## 2023-01-06 MED ORDER — TERCONAZOLE 0.8 % VA CREA: TOPICAL_CREAM | VAGINAL | 0 refills | Status: DC

## 2023-01-06 MED ORDER — FLUCONAZOLE 150 MG PO TABS: 150.0000 mg | ORAL_TABLET | ORAL | 0 refills | Status: DC | PRN

## 2023-01-06 NOTE — Progress Notes (Signed)
Virtual Visit Consent   Julia Lyons, you are scheduled for a virtual visit with a [facility] provider today. Just as with appointments in the office, your consent must be obtained to participate. Your consent will be active for this visit and any virtual visit you may have with one of our providers in the next 365 days. If you have a MyChart account, a copy of this consent can be sent to you electronically.  As this is a virtual visit, video technology does not allow for your provider to perform a traditional examination. This may limit your provider's ability to fully assess your condition. If your provider identifies any concerns that need to be evaluated in person or the need to arrange testing (such as labs, EKG, etc.), we will make arrangements to do so. Although advances in technology are sophisticated, we cannot ensure that it will always work on either your end or our end. If the connection with a video visit is poor, the visit may have to be switched to a telephone visit. With either a video or telephone visit, we are not always able to ensure that we have a secure connection.  By engaging in this virtual visit, you consent to the provision of healthcare and authorize for your insurance to be billed (if applicable) for the services provided during this visit. Depending on your insurance coverage, you may receive a charge related to this service.  I need to obtain your verbal consent now. Are you willing to proceed with your visit today? Julia Lyons has provided verbal consent on 01/06/2023 for a virtual visit (video or telephone). Julia Loveless, PA-C  Date: 01/06/2023 8:29 AM  Virtual Visit via Video Note   I, Julia Lyons, connected with  Julia Lyons  ([id number], 21-Nov-1990) on 01/06/23 at  8:30 AM EDT by a video-enabled telemedicine application and verified that I am speaking with the correct person using two identifiers.  Location: Patient: Virtual  Visit Location Patient: Home Provider: Virtual Visit Location Provider: Home Office   I discussed the limitations of evaluation and management by telemedicine and the availability of in person appointments. The patient expressed understanding and agreed to proceed.    History of Present Illness: Julia Lyons is a 32 y.o. who identifies as a female who was assigned female at birth, and is being seen today for vaginal issues.  HPI: Vaginal Discharge The patient's primary symptoms include genital itching, vaginal bleeding (started menstrual on Saturday) and vaginal discharge. The patient's pertinent negatives include no genital odor or pelvic pain. This is a new problem. The current episode started in the past 7 days. The problem occurs constantly. The problem has been gradually worsening. The pain is mild. The problem affects both sides. She is not pregnant. Pertinent negatives include no abdominal pain, back pain, chills, constipation, diarrhea, discolored urine, dysuria, fever, flank pain, frequency, headaches, nausea or vomiting. The vaginal discharge was white and thick. The vaginal bleeding is typical of menses. She has not been passing clots. She has not been passing tissue. The symptoms are aggravated by tactile pressure. She has tried antifungals (monistat) for the symptoms. The treatment provided no relief.   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   Blood type O+ 07/21/2017   Hemorrhoids 12/04/2016   Seasonal allergic rhinitis due to pollen  09/27/2016   Exercise-induced asthma 10/22/2011    Allergies:  Allergies  Allergen Reactions   Depo-Provera [Medroxyprogesterone] Other (See Comments)    Severe migraine and muscle spasms   Medications:  Current Outpatient Medications:    fluconazole (DIFLUCAN) 150 MG tablet,  Take 1 tablet (150 mg total) by mouth every 3 (three) days as needed., Disp: 2 tablet, Rfl: 0   terconazole (TERAZOL 3) 0.8 % vaginal cream, Use topically as directed by provider, Disp: 20 g, Rfl: 0   albuterol (VENTOLIN HFA) 108 (90 Base) MCG/ACT inhaler, Inhale 2 puffs into the lungs every 6 (six) hours as needed for wheezing or shortness of breath., Disp: 18 g, Rfl: 0   ALPRAZolam (XANAX) 0.25 MG tablet, Take 0.25 mg by mouth daily as needed., Disp: , Rfl:    cetirizine (ZYRTEC) 10 MG tablet, Take 1 tablet (10 mg total) by mouth daily., Disp: 90 tablet, Rfl: 3   cholecalciferol (D-VI-SOL) 10 MCG/ML LIQD, Vitamin D, Disp: , Rfl:    CLOBEX 0.05 % shampoo, Shampoo into scalp let sit 10 minutes then wash out. Use 2 times per week., Disp: 118 mL, Rfl: 11   ketoconazole (NIZORAL) 2 % shampoo, Shampoo into scalp let sit 10 minutes then wash out. Use twice per week., Disp: 120 mL, Rfl: 11   mometasone (ELOCON) 0.1 % lotion, Apply topically daily. Apply to scalp once a day at bedtime for 2 weeks, then decrease to 5 nights a week PRN flares, Disp: 60 mL, Rfl: 3   nitrofurantoin, macrocrystal-monohydrate, (MACROBID) 100 MG capsule, Take 1 capsule (100 mg total) by mouth 2 (two) times daily., Disp: 10 capsule, Rfl: 0   Omega-3 Fatty Acids (FISH OIL PO), Take by mouth., Disp: , Rfl:    Probiotic Product (PROBIOTIC DAILY PO), Take by mouth., Disp: , Rfl:   Observations/Objective: Patient is well-developed, well-nourished in no acute distress.  Resting comfortably at home.  Head is normocephalic, atraumatic.  No labored breathing.  Speech is clear and coherent with logical content.  Patient is alert and oriented at baseline.    Assessment and Plan: 1. Antibiotic-induced yeast infection - fluconazole (DIFLUCAN) 150 MG tablet; Take 1 tablet (150 mg total) by mouth every 3 (three) days as needed.  Dispense: 2 tablet; Refill: 0 - terconazole (TERAZOL 3) 0.8 % vaginal cream; Use topically as directed by  provider  Dispense: 20 g; Refill: 0  - Symptoms consistent with yeast vaginitis - Fluconazole prescribed; Terconazole prescribed for topical irritation (advised to use on external tissues that are irritated by removing applicator tip, applying a small amount to finger tip of index finger then applying to affected areas externally) - Limit bubble baths, scented lotions/soaps/detergents - Limit tight fitting clothing - Seek on person evaluation if not improving or if symptoms worsen   Follow Up Instructions: I discussed the assessment and treatment plan with the patient. The patient was provided an opportunity to ask questions and all were answered. The patient agreed with the plan and demonstrated an understanding of the instructions.  A copy of instructions were sent to the patient via MyChart unless otherwise noted below.    The patient was advised to call back or seek an in-person evaluation if the symptoms worsen or if the condition fails to improve as anticipated.  Time:  I spent 10 minutes with the patient via telehealth technology discussing the above problems/concerns.    Julia Loveless, PA-C

## 2023-01-06 NOTE — Patient Instructions (Signed)
Genice Rouge, thank you for joining [name], PA-C for today's virtual visit.  While this provider is not your primary care provider (PCP), if your PCP is located in our provider database this encounter information will be shared with them immediately following your visit.   A [facility] MyChart account gives you access to today's visit and all your visits, tests, and labs performed at [facility] " click here if you don't have a [facility] MyChart account or go to [email or web address]  Consent: (Patient) Julia Lyons provided verbal consent for this virtual visit at the beginning of the encounter.  Current Medications:  Current Outpatient Medications:    fluconazole (DIFLUCAN) 150 MG tablet, Take 1 tablet (150 mg total) by mouth every 3 (three) days as needed., Disp: 2 tablet, Rfl: 0   terconazole (TERAZOL 3) 0.8 % vaginal cream, Use topically as directed by provider, Disp: 20 g, Rfl: 0   albuterol (VENTOLIN HFA) 108 (90 Base) MCG/ACT inhaler, Inhale 2 puffs into the lungs every 6 (six) hours as needed for wheezing or shortness of breath., Disp: 18 g, Rfl: 0   ALPRAZolam (XANAX) 0.25 MG tablet, Take 0.25 mg by mouth daily as needed., Disp: , Rfl:    cetirizine (ZYRTEC) 10 MG tablet, Take 1 tablet (10 mg total) by mouth daily., Disp: 90 tablet, Rfl: 3   cholecalciferol (D-VI-SOL) 10 MCG/ML LIQD, Vitamin D, Disp: , Rfl:    CLOBEX 0.05 % shampoo, Shampoo into scalp let sit 10 minutes then wash out. Use 2 times per week., Disp: 118 mL, Rfl: 11   ketoconazole (NIZORAL) 2 % shampoo, Shampoo into scalp let sit 10 minutes then wash out. Use twice per week., Disp: 120 mL, Rfl: 11   mometasone (ELOCON) 0.1 % lotion, Apply topically daily. Apply to scalp once a day at bedtime for 2 weeks, then decrease to 5 nights a week PRN flares, Disp: 60 mL, Rfl: 3   nitrofurantoin, macrocrystal-monohydrate, (MACROBID) 100 MG capsule, Take 1 capsule (100 mg total) by mouth 2  (two) times daily., Disp: 10 capsule, Rfl: 0   Omega-3 Fatty Acids (FISH OIL PO), Take by mouth., Disp: , Rfl:    Probiotic Product (PROBIOTIC DAILY PO), Take by mouth., Disp: , Rfl:    Medications ordered in this encounter:  Meds ordered this encounter  Medications   fluconazole (DIFLUCAN) 150 MG tablet    Sig: Take 1 tablet (150 mg total) by mouth every 3 (three) days as needed.    Dispense:  2 tablet    Refill:  0    Order Specific Question:   Supervising Provider    Answer:   Merrilee Jansky [[id number]]   terconazole (TERAZOL 3) 0.8 % vaginal cream    Sig: Use topically as directed by provider    Dispense:  20 g    Refill:  0    Order Specific Question:   Supervising Provider    Answer:   Merrilee Jansky [[id number]]     *If you need refills on other medications prior to your next appointment, please contact your pharmacy*  Follow-Up: Call back or seek an in-person evaluation if the symptoms worsen or if the condition fails to improve as anticipated.   Virtual Care [phone]  Other Instructions Vaginal Yeast Infection, Adult  Vaginal yeast infection is a condition that causes vaginal discharge as well as soreness, swelling, and redness (inflammation) of the vagina. This is a common condition. Some women get this infection frequently. What  are the causes? This condition is caused by a change in the normal balance of the yeast (Candida) and normal bacteria that live in the vagina. This change causes an overgrowth of yeast, which causes the inflammation. What increases the risk? The condition is more likely to develop in women who: Take antibiotic medicines. Have diabetes. Take birth control pills. Are pregnant. Douche often. Have a weak body defense system (immune system). Have been taking steroid medicines for a long time. Frequently wear tight clothing. What are the signs or symptoms? Symptoms of this condition include: White, thick, creamy vaginal  discharge. Swelling, itching, redness, and irritation of the vagina. The lips of the vagina (labia) may be affected as well. Pain or a burning feeling while urinating. Pain during sex. How is this diagnosed? This condition is diagnosed based on: Your medical history. A physical exam. A pelvic exam. Your health care provider will examine a sample of your vaginal discharge under a microscope. Your health care provider may send this sample for testing to confirm the diagnosis. How is this treated? This condition is treated with medicine. Medicines may be over-the-counter or prescription. You may be told to use one or more of the following: Medicine that is taken by mouth (orally). Medicine that is applied as a cream (topically). Medicine that is inserted directly into the vagina (suppository). Follow these instructions at home: Take or apply over-the-counter and prescription medicines only as told by your health care provider. Do not use tampons until your health care provider approves. Do not have sex until your infection has cleared. Sex can prolong or worsen your symptoms of infection. Ask your health care provider when it is safe to resume sexual activity. Keep all follow-up visits. This is important. How is this prevented?  Do not wear tight clothes, such as pantyhose or tight pants. Wear breathable cotton underwear. Do not use douches, perfumed soap, creams, or powders. Wipe from front to back after using the toilet. If you have diabetes, keep your blood sugar levels under control. Ask your health care provider for other ways to prevent yeast infections. Contact a health care provider if: You have a fever. Your symptoms go away and then return. Your symptoms do not get better with treatment. Your symptoms get worse. You have new symptoms. You develop blisters in or around your vagina. You have blood coming from your vagina and it is not your menstrual period. You develop pain in  your abdomen. Summary Vaginal yeast infection is a condition that causes discharge as well as soreness, swelling, and redness (inflammation) of the vagina. This condition is treated with medicine. Medicines may be over-the-counter or prescription. Take or apply over-the-counter and prescription medicines only as told by your health care provider. Do not douche. Resume sexual activity or use of tampons as instructed by your health care provider. Contact a health care provider if your symptoms do not get better with treatment or your symptoms go away and then return. This information is not intended to replace advice given to you by your health care provider. Make sure you discuss any questions you have with your health care provider. Document Revised: 07/31/2020 Document Reviewed: 07/31/2020 Elsevier Patient Education  2024 Elsevier Inc.    If you have been instructed to have an in-person evaluation today at a local Urgent Care facility, please use the link below. It will take you to a list of all of our available [facility], including address, phone number and hours of operation.  Please do not delay care.  [facility]  If you or a family member do not have a primary care provider, use the link below to schedule a visit and establish care. When you choose a [facility] primary care physician or advanced practice provider, you gain a long-term partner in health. Find a Primary Care Provider  Learn more about [facility]'s in-office and virtual care options: [facility] - Get Care Now

## 2023-01-08 DIAGNOSIS — E785 Hyperlipidemia, unspecified: Secondary | ICD-10-CM | POA: Diagnosis not present

## 2023-01-08 DIAGNOSIS — H6123 Impacted cerumen, bilateral: Secondary | ICD-10-CM | POA: Diagnosis not present

## 2023-01-08 DIAGNOSIS — F419 Anxiety disorder, unspecified: Secondary | ICD-10-CM | POA: Diagnosis not present

## 2023-01-08 DIAGNOSIS — G4700 Insomnia, unspecified: Secondary | ICD-10-CM | POA: Diagnosis not present

## 2023-01-08 DIAGNOSIS — E039 Hypothyroidism, unspecified: Secondary | ICD-10-CM | POA: Diagnosis not present

## 2023-01-08 DIAGNOSIS — Z79899 Other long term (current) drug therapy: Secondary | ICD-10-CM | POA: Diagnosis not present

## 2023-01-08 DIAGNOSIS — E559 Vitamin D deficiency, unspecified: Secondary | ICD-10-CM | POA: Diagnosis not present

## 2023-01-08 DIAGNOSIS — R5383 Other fatigue: Secondary | ICD-10-CM | POA: Diagnosis not present

## 2023-01-08 DIAGNOSIS — R5381 Other malaise: Secondary | ICD-10-CM | POA: Diagnosis not present

## 2023-02-06 DIAGNOSIS — E282 Polycystic ovarian syndrome: Secondary | ICD-10-CM | POA: Diagnosis not present

## 2023-02-06 DIAGNOSIS — R5383 Other fatigue: Secondary | ICD-10-CM | POA: Diagnosis not present

## 2023-02-06 DIAGNOSIS — R5381 Other malaise: Secondary | ICD-10-CM | POA: Diagnosis not present

## 2023-02-06 DIAGNOSIS — I10 Essential (primary) hypertension: Secondary | ICD-10-CM | POA: Diagnosis not present

## 2023-02-20 ENCOUNTER — Encounter (INDEPENDENT_AMBULATORY_CARE_PROVIDER_SITE_OTHER): Payer: Self-pay | Admitting: Nurse Practitioner

## 2023-02-20 ENCOUNTER — Ambulatory Visit (INDEPENDENT_AMBULATORY_CARE_PROVIDER_SITE_OTHER): Payer: Medicaid Other | Admitting: Nurse Practitioner

## 2023-02-20 DIAGNOSIS — I8311 Varicose veins of right lower extremity with inflammation: Secondary | ICD-10-CM

## 2023-02-20 DIAGNOSIS — I8310 Varicose veins of unspecified lower extremity with inflammation: Secondary | ICD-10-CM

## 2023-02-21 NOTE — Progress Notes (Signed)
Varicose veins of right  lower extremity with inflammation (454.1  I83.10) Current Plans   Indication: Patient presents with symptomatic varicose veins of the right  lower extremity.   Procedure: Sclerotherapy using hypertonic saline mixed with 1% Lidocaine was performed on the right lower extremity. Compression wraps were placed. The patient tolerated the procedure well. 

## 2023-03-21 ENCOUNTER — Ambulatory Visit (INDEPENDENT_AMBULATORY_CARE_PROVIDER_SITE_OTHER): Payer: Medicaid Other | Admitting: Nurse Practitioner

## 2023-03-21 ENCOUNTER — Encounter (INDEPENDENT_AMBULATORY_CARE_PROVIDER_SITE_OTHER): Payer: Self-pay | Admitting: Nurse Practitioner

## 2023-03-21 VITALS — BP 107/60 | HR 65 | Resp 16 | Wt 125.6 lb

## 2023-03-21 DIAGNOSIS — I8311 Varicose veins of right lower extremity with inflammation: Secondary | ICD-10-CM

## 2023-03-21 DIAGNOSIS — I8310 Varicose veins of unspecified lower extremity with inflammation: Secondary | ICD-10-CM

## 2023-03-21 NOTE — Progress Notes (Signed)
Varicose veins of right  lower extremity with inflammation (454.1  I83.10) Current Plans   Indication: Patient presents with symptomatic varicose veins of the right  lower extremity.   Procedure: Sclerotherapy using hypertonic saline mixed with 1% Lidocaine was performed on the right lower extremity. Compression wraps were placed. The patient tolerated the procedure well. 

## 2023-04-11 DIAGNOSIS — F3340 Major depressive disorder, recurrent, in remission, unspecified: Secondary | ICD-10-CM | POA: Diagnosis not present

## 2023-04-11 DIAGNOSIS — E785 Hyperlipidemia, unspecified: Secondary | ICD-10-CM | POA: Diagnosis not present

## 2023-04-21 ENCOUNTER — Ambulatory Visit (INDEPENDENT_AMBULATORY_CARE_PROVIDER_SITE_OTHER): Payer: Medicaid Other | Admitting: Nurse Practitioner

## 2023-05-02 ENCOUNTER — Ambulatory Visit (INDEPENDENT_AMBULATORY_CARE_PROVIDER_SITE_OTHER): Payer: Medicaid Other | Admitting: Nurse Practitioner

## 2023-05-08 ENCOUNTER — Telehealth: Payer: Medicaid Other

## 2023-05-08 DIAGNOSIS — J019 Acute sinusitis, unspecified: Secondary | ICD-10-CM

## 2023-05-08 DIAGNOSIS — H1032 Unspecified acute conjunctivitis, left eye: Secondary | ICD-10-CM | POA: Diagnosis not present

## 2023-05-08 DIAGNOSIS — B9689 Other specified bacterial agents as the cause of diseases classified elsewhere: Secondary | ICD-10-CM

## 2023-05-08 MED ORDER — AMOXICILLIN-POT CLAVULANATE 875-125 MG PO TABS: 1.0000 | ORAL_TABLET | Freq: Two times a day (BID) | ORAL | 0 refills | Status: DC

## 2023-05-08 MED ORDER — POLYMYXIN B-TRIMETHOPRIM 10000-0.1 UNIT/ML-% OP SOLN: OPHTHALMIC | 0 refills | Status: DC

## 2023-05-08 NOTE — Patient Instructions (Signed)
Genice Rouge, thank you for joining [name], PA-C for today's virtual visit.  While this provider is not your primary care provider (PCP), if your PCP is located in our provider database this encounter information will be shared with them immediately following your visit.   A [facility] MyChart account gives you access to today's visit and all your visits, tests, and labs performed at [facility] " click here if you don't have a [facility] MyChart account or go to [email or web address]  Consent: (Patient) Julia Lyons provided verbal consent for this virtual visit at the beginning of the encounter.  Current Medications:  Current Outpatient Medications:    albuterol (VENTOLIN HFA) 108 (90 Base) MCG/ACT inhaler, Inhale 2 puffs into the lungs every 6 (six) hours as needed for wheezing or shortness of breath., Disp: 18 g, Rfl: 0   ALPRAZolam (XANAX) 0.25 MG tablet, Take 0.25 mg by mouth daily as needed., Disp: , Rfl:    cetirizine (ZYRTEC) 10 MG tablet, Take 1 tablet (10 mg total) by mouth daily., Disp: 90 tablet, Rfl: 3   cholecalciferol (D-VI-SOL) 10 MCG/ML LIQD, Vitamin D, Disp: , Rfl:    CLOBEX 0.05 % shampoo, Shampoo into scalp let sit 10 minutes then wash out. Use 2 times per week., Disp: 118 mL, Rfl: 11   fluconazole (DIFLUCAN) 150 MG tablet, Take 1 tablet (150 mg total) by mouth every 3 (three) days as needed., Disp: 2 tablet, Rfl: 0   ketoconazole (NIZORAL) 2 % shampoo, Shampoo into scalp let sit 10 minutes then wash out. Use twice per week., Disp: 120 mL, Rfl: 11   mometasone (ELOCON) 0.1 % lotion, Apply topically daily. Apply to scalp once a day at bedtime for 2 weeks, then decrease to 5 nights a week PRN flares, Disp: 60 mL, Rfl: 3   nitrofurantoin, macrocrystal-monohydrate, (MACROBID) 100 MG capsule, Take 1 capsule (100 mg total) by mouth 2 (two) times daily., Disp: 10 capsule, Rfl: 0   Omega-3 Fatty Acids (FISH OIL PO), Take by mouth., Disp:  , Rfl:    Probiotic Product (PROBIOTIC DAILY PO), Take by mouth., Disp: , Rfl:    terconazole (TERAZOL 3) 0.8 % vaginal cream, Use topically as directed by provider, Disp: 20 g, Rfl: 0   Medications ordered in this encounter:  No orders of the defined types were placed in this encounter.    *If you need refills on other medications prior to your next appointment, please contact your pharmacy*  Follow-Up: Call back or seek an in-person evaluation if the symptoms worsen or if the condition fails to improve as anticipated.  [facility] [phone]  Other Instructions Please take antibiotic as directed.  Increase fluid intake.  Use Saline nasal spray.  Take a daily multivitamin. Use the Polytrim as directed. Continue use of your Zyrtec -- you can switch to Zyrtec-D instead during this time.  Place a humidifier in the bedroom.  Please call or return clinic if symptoms are not improving.  Sinusitis Sinusitis is redness, soreness, and swelling (inflammation) of the paranasal sinuses. Paranasal sinuses are air pockets within the bones of your face (beneath the eyes, the middle of the forehead, or above the eyes). In healthy paranasal sinuses, mucus is able to drain out, and air is able to circulate through them by way of your nose. However, when your paranasal sinuses are inflamed, mucus and air can become trapped. This can allow bacteria and other germs to grow and cause infection. Sinusitis can develop  quickly and last only a short time (acute) or continue over a long period (chronic). Sinusitis that lasts for more than 12 weeks is considered chronic.  CAUSES  Causes of sinusitis include: Allergies. Structural abnormalities, such as displacement of the cartilage that separates your nostrils (deviated septum), which can decrease the air flow through your nose and sinuses and affect sinus drainage. Functional abnormalities, such as when the small hairs (cilia) that line your sinuses  and help remove mucus do not work properly or are not present. SYMPTOMS  Symptoms of acute and chronic sinusitis are the same. The primary symptoms are pain and pressure around the affected sinuses. Other symptoms include: Upper toothache. Earache. Headache. Bad breath. Decreased sense of smell and taste. A cough, which worsens when you are lying flat. Fatigue. Fever. Thick drainage from your nose, which often is green and may contain pus (purulent). Swelling and warmth over the affected sinuses. DIAGNOSIS  Your caregiver will perform a physical exam. During the exam, your caregiver may: Look in your nose for signs of abnormal growths in your nostrils (nasal polyps). Tap over the affected sinus to check for signs of infection. View the inside of your sinuses (endoscopy) with a special imaging device with a light attached (endoscope), which is inserted into your sinuses. If your caregiver suspects that you have chronic sinusitis, one or more of the following tests may be recommended: Allergy tests. Nasal culture A sample of mucus is taken from your nose and sent to a lab and screened for bacteria. Nasal cytology A sample of mucus is taken from your nose and examined by your caregiver to determine if your sinusitis is related to an allergy. TREATMENT  Most cases of acute sinusitis are related to a viral infection and will resolve on their own within 10 days. Sometimes medicines are prescribed to help relieve symptoms (pain medicine, decongestants, nasal steroid sprays, or saline sprays).  However, for sinusitis related to a bacterial infection, your caregiver will prescribe antibiotic medicines. These are medicines that will help kill the bacteria causing the infection.  Rarely, sinusitis is caused by a fungal infection. In theses cases, your caregiver will prescribe antifungal medicine. For some cases of chronic sinusitis, surgery is needed. Generally, these are cases in which sinusitis  recurs more than 3 times per year, despite other treatments. HOME CARE INSTRUCTIONS  Drink plenty of water. Water helps thin the mucus so your sinuses can drain more easily. Use a humidifier. Inhale steam 3 to 4 times a day (for example, sit in the bathroom with the shower running). Apply a warm, moist washcloth to your face 3 to 4 times a day, or as directed by your caregiver. Use saline nasal sprays to help moisten and clean your sinuses. Take over-the-counter or prescription medicines for pain, discomfort, or fever only as directed by your caregiver. SEEK IMMEDIATE MEDICAL CARE IF: You have increasing pain or severe headaches. You have nausea, vomiting, or drowsiness. You have swelling around your face. You have vision problems. You have a stiff neck. You have difficulty breathing. MAKE SURE YOU:  Understand these instructions. Will watch your condition. Will get help right away if you are not doing well or get worse. Document Released: 05/13/2005 Document Revised: 08/05/2011 Document Reviewed: 05/28/2011 [facility] Patient Information 2014 [place], [place].    If you have been instructed to have an in-person evaluation today at a local Urgent Care facility, please use the link below. It will take you to a list of all of our  available [facility], including address, phone number and hours of operation. Please do not delay care.  [facility]  If you or a family member do not have a primary care provider, use the link below to schedule a visit and establish care. When you choose a [facility] primary care physician or advanced practice provider, you gain a long-term partner in health. Find a Primary Care Provider  Learn more about [facility]'s in-office and virtual care options: [facility] - Get Care Now

## 2023-05-08 NOTE — Progress Notes (Signed)
Virtual Visit Consent   [name], you are scheduled for a virtual visit with a [facility] provider today. Just as with appointments in the office, your consent must be obtained to participate. Your consent will be active for this visit and any virtual visit you may have with one of our providers in the next 365 days. If you have a MyChart account, a copy of this consent can be sent to you electronically.  As this is a virtual visit, video technology does not allow for your provider to perform a traditional examination. This may limit your provider's ability to fully assess your condition. If your provider identifies any concerns that need to be evaluated in person or the need to arrange testing (such as labs, EKG, etc.), we will make arrangements to do so. Although advances in technology are sophisticated, we cannot ensure that it will always work on either your end or our end. If the connection with a video visit is poor, the visit may have to be switched to a telephone visit. With either a video or telephone visit, we are not always able to ensure that we have a secure connection.  By engaging in this virtual visit, you consent to the provision of healthcare and authorize for your insurance to be billed (if applicable) for the services provided during this visit. Depending on your insurance coverage, you may receive a charge related to this service.  I need to obtain your verbal consent now. Are you willing to proceed with your visit today? [name] has provided verbal consent on 05/08/2023 for a virtual visit (video or telephone). [name], [place]  Date: 05/08/2023 8:18 AM  Virtual Visit via Video Note   I, [name], connected with  Julia Lyons  ([id number], 30-04-1991) on 05/08/23 at  7:45 AM EST by a video-enabled telemedicine application and verified that I am speaking with the correct person using two identifiers.  Location: Patient: Virtual  Visit Location Patient: Home Provider: Virtual Visit Location Provider: Home Office   I discussed the limitations of evaluation and management by telemedicine and the availability of in person appointments. The patient expressed understanding and agreed to proceed.    History of Present Illness: Julia Lyons is a 32 y.o. who identifies as a female who was assigned female at birth, and is being seen today for feeling under the weather since last Thursday. Started with fatigue, aches and sore throat with some nasal congestion and PND. Since Tuesday noting acute worsening of symptoms with severe sore throat, waxing/waning in severity. Notes sinus pain and today with low-grade fever as well as left eye injection with drainage and matting. 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   Zinc, Vitamin C, Tea with honey.  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   Blood type O+ 07/21/2017   Hemorrhoids 12/04/2016   Seasonal allergic rhinitis due to pollen 09/27/2016   Exercise-induced asthma 10/22/2011    Allergies:  Allergies  Allergen Reactions   Depo-Provera [Medroxyprogesterone] Other (See Comments)    Severe migraine and muscle spasms   Medications:  Current Outpatient Medications:    albuterol (VENTOLIN HFA) 108 (90 Base) MCG/ACT inhaler, Inhale 2 puffs into the lungs every 6 (six) hours as needed for wheezing or shortness of breath., Disp: 18 g, Rfl: 0   cetirizine (ZYRTEC) 10  MG tablet, Take 1 tablet (10 mg total) by mouth daily., Disp: 90 tablet, Rfl: 3   cholecalciferol (D-VI-SOL) 10 MCG/ML LIQD, Vitamin D, Disp: , Rfl:    CLOBEX 0.05 % shampoo, Shampoo into scalp let sit 10 minutes then wash out. Use 2 times per week., Disp: 118 mL, Rfl: 11   ketoconazole (NIZORAL) 2 % shampoo, Shampoo into scalp let sit 10 minutes then wash out. Use  twice per week., Disp: 120 mL, Rfl: 11   mometasone (ELOCON) 0.1 % lotion, Apply topically daily. Apply to scalp once a day at bedtime for 2 weeks, then decrease to 5 nights a week PRN flares, Disp: 60 mL, Rfl: 3   Omega-3 Fatty Acids (FISH OIL PO), Take by mouth., Disp: , Rfl:    Probiotic Product (PROBIOTIC DAILY PO), Take by mouth., Disp: , Rfl:   Observations/Objective: Patient is well-developed, well-nourished in no acute distress.  Resting comfortably  at home.  Head is normocephalic, atraumatic.  No labored breathing.  Speech is clear and coherent with logical content.  Patient is alert and oriented at baseline.  Mild posterior oropharyngeal erythema without tonsillar swelling or exudate. Uvula midline and without edema or lesion.   Assessment and Plan: 1. Acute bacterial sinusitis (Primary)  2. Acute conjunctivitis of left eye, unspecified acute conjunctivitis type  Rx Augmentin.  Increase fluids.  Rest.  Saline nasal spray.  Probiotic.  Mucinex as directed.  Humidifier in bedroom. Polytrim per orders.  Call or return to clinic if symptoms are not improving.   Follow Up Instructions: I discussed the assessment and treatment plan with the patient. The patient was provided an opportunity to ask questions and all were answered. The patient agreed with the plan and demonstrated an understanding of the instructions.  A copy of instructions were sent to the patient via MyChart unless otherwise noted below.    The patient was advised to call back or seek an in-person evaluation if the symptoms worsen or if the condition fails to improve as anticipated.    [name], PA-C

## 2023-05-09 ENCOUNTER — Ambulatory Visit (INDEPENDENT_AMBULATORY_CARE_PROVIDER_SITE_OTHER): Payer: Medicaid Other | Admitting: Nurse Practitioner

## 2023-05-09 VITALS — BP 112/72 | HR 94 | Resp 16 | Wt 130.8 lb

## 2023-05-09 DIAGNOSIS — I8311 Varicose veins of right lower extremity with inflammation: Secondary | ICD-10-CM

## 2023-05-09 DIAGNOSIS — I8310 Varicose veins of unspecified lower extremity with inflammation: Secondary | ICD-10-CM

## 2023-05-12 ENCOUNTER — Encounter (INDEPENDENT_AMBULATORY_CARE_PROVIDER_SITE_OTHER): Payer: Self-pay | Admitting: Nurse Practitioner

## 2023-05-12 NOTE — Progress Notes (Signed)
Varicose veins of right  lower extremity with inflammation (454.1  I83.10) Current Plans   Indication: Patient presents with symptomatic varicose veins of the right  lower extremity.   Procedure: Sclerotherapy using hypertonic saline mixed with 1% Lidocaine was performed on the right lower extremity. Compression wraps were placed. The patient tolerated the procedure well. 

## 2023-05-24 ENCOUNTER — Telehealth: Payer: Medicaid Other | Admitting: Nurse Practitioner

## 2023-05-24 DIAGNOSIS — J069 Acute upper respiratory infection, unspecified: Secondary | ICD-10-CM | POA: Diagnosis not present

## 2023-05-24 MED ORDER — PREDNISONE 20 MG PO TABS: 20.0000 mg | ORAL_TABLET | Freq: Every day | ORAL | 0 refills | Status: AC

## 2023-05-24 MED ORDER — FLUTICASONE PROPIONATE 50 MCG/ACT NA SUSP: 2.0000 | Freq: Every day | NASAL | 0 refills | Status: AC

## 2023-05-24 NOTE — Progress Notes (Signed)
Virtual Visit Consent   Julia Lyons, you are scheduled for a virtual visit with a [facility] provider today. Just as with appointments in the office, your consent must be obtained to participate. Your consent will be active for this visit and any virtual visit you may have with one of our providers in the next 365 days. If you have a MyChart account, a copy of this consent can be sent to you electronically.  As this is a virtual visit, video technology does not allow for your provider to perform a traditional examination. This may limit your provider's ability to fully assess your condition. If your provider identifies any concerns that need to be evaluated in person or the need to arrange testing (such as labs, EKG, etc.), we will make arrangements to do so. Although advances in technology are sophisticated, we cannot ensure that it will always work on either your end or our end. If the connection with a video visit is poor, the visit may have to be switched to a telephone visit. With either a video or telephone visit, we are not always able to ensure that we have a secure connection.  By engaging in this virtual visit, you consent to the provision of healthcare and authorize for your insurance to be billed (if applicable) for the services provided during this visit. Depending on your insurance coverage, you may receive a charge related to this service.  I need to obtain your verbal consent now. Are you willing to proceed with your visit today? Julia Lyons has provided verbal consent on 05/24/2023 for a virtual visit (video or telephone). Claiborne Rigg, NP  Date: 05/24/2023 9:32 AM  Virtual Visit via Video Note   I, Claiborne Rigg, connected with  Julia Lyons  ([id number], 1991/01/02) on 05/24/23 at  9:30 AM EST by a video-enabled telemedicine application and verified that I am speaking with the correct person using two identifiers.  Location: Patient: Virtual Visit  Location Patient: Home Provider: Virtual Visit Location Provider: Home Office   I discussed the limitations of evaluation and management by telemedicine and the availability of in person appointments. The patient expressed understanding and agreed to proceed.    History of Present Illness: Julia Lyons is a 32 y.o. who identifies as a female who was assigned female at birth, and is being seen today for URI symptoms with congestion.   Julia Lyons reports the following symptoms with 2 days onset: Transient fever with Tmax 100 degrees, nasal congestion, sore throat, bilateral ear pressure, minimal dry cough and generalized bodyaches.  Temperature is currently normal tmp.  She has been self treating with Zicam, zinc, vitamin C and nyquil.   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   Blood type O+ 07/21/2017   Hemorrhoids 12/04/2016   Seasonal allergic rhinitis due to pollen 09/27/2016   Exercise-induced asthma 10/22/2011    Allergies:  Allergies  Allergen Reactions   Depo-Provera [Medroxyprogesterone] Other (See Comments)    Severe migraine and muscle spasms   Medications:  Current Outpatient Medications:    fluticasone (FLONASE) 50 MCG/ACT nasal spray, Place 2 sprays into both nostrils daily., Disp: 16 g, Rfl: 0   predniSONE (DELTASONE) 20 MG tablet, Take 1 tablet (20 mg total) by mouth daily with breakfast for 5 days., Disp: 5 tablet, Rfl: 0   albuterol (VENTOLIN HFA) 108 (90 Base) MCG/ACT inhaler, Inhale 2  puffs into the lungs every 6 (six) hours as needed for wheezing or shortness of breath., Disp: 18 g, Rfl: 0   cetirizine (ZYRTEC) 10 MG tablet, Take 1 tablet (10 mg total) by mouth daily., Disp: 90 tablet, Rfl: 3   cholecalciferol (D-VI-SOL) 10 MCG/ML LIQD, Vitamin D, Disp: , Rfl:    CLOBEX 0.05 % shampoo, Shampoo into scalp let sit 10 minutes then wash out. Use 2  times per week., Disp: 118 mL, Rfl: 11   ketoconazole (NIZORAL) 2 % shampoo, Shampoo into scalp let sit 10 minutes then wash out. Use twice per week., Disp: 120 mL, Rfl: 11   mometasone (ELOCON) 0.1 % lotion, Apply topically daily. Apply to scalp once a day at bedtime for 2 weeks, then decrease to 5 nights a week PRN flares, Disp: 60 mL, Rfl: 3   Omega-3 Fatty Acids (FISH OIL PO), Take by mouth., Disp: , Rfl:    Probiotic Product (PROBIOTIC DAILY PO), Take by mouth., Disp: , Rfl:    trimethoprim-polymyxin b (POLYTRIM) ophthalmic solution, Apply 1-2 drops into affected eye QID x 5 days., Disp: 10 mL, Rfl: 0  Observations/Objective: Patient is well-developed, well-nourished in no acute distress.  Resting comfortably at home.  Head is normocephalic, atraumatic.  No labored breathing.  Speech is clear and coherent with logical content.  Patient is alert and oriented at baseline.    Assessment and Plan: 1. Viral URI with cough (Primary) - predniSONE (DELTASONE) 20 MG tablet; Take 1 tablet (20 mg total) by mouth daily with breakfast for 5 days.  Dispense: 5 tablet; Refill: 0 - fluticasone (FLONASE) 50 MCG/ACT nasal spray; Place 2 sprays into both nostrils daily.  Dispense: 16 g; Refill: 0  INSTRUCTIONS: use a humidifier for nasal congestion Drink plenty of fluids, rest and wash hands frequently to avoid the spread of infection Alternate tylenol and Motrin for relief of fever   Follow Up Instructions: I discussed the assessment and treatment plan with the patient. The patient was provided an opportunity to ask questions and all were answered. The patient agreed with the plan and demonstrated an understanding of the instructions.  A copy of instructions were sent to the patient via MyChart unless otherwise noted below.     The patient was advised to call back or seek an in-person evaluation if the symptoms worsen or if the condition fails to improve as anticipated.    Claiborne Rigg, NP

## 2023-05-24 NOTE — Patient Instructions (Signed)
Genice Rouge, thank you for joining Claiborne Rigg, NP for today's virtual visit.  While this provider is not your primary care provider (PCP), if your PCP is located in our provider database this encounter information will be shared with them immediately following your visit.   A [facility] MyChart account gives you access to today's visit and all your visits, tests, and labs performed at [facility] " click here if you don't have a [facility] MyChart account or go to [email or web address]  Consent: (Patient) Julia Lyons provided verbal consent for this virtual visit at the beginning of the encounter.  Current Medications:  Current Outpatient Medications:    fluticasone (FLONASE) 50 MCG/ACT nasal spray, Place 2 sprays into both nostrils daily., Disp: 16 g, Rfl: 0   predniSONE (DELTASONE) 20 MG tablet, Take 1 tablet (20 mg total) by mouth daily with breakfast for 5 days., Disp: 5 tablet, Rfl: 0   albuterol (VENTOLIN HFA) 108 (90 Base) MCG/ACT inhaler, Inhale 2 puffs into the lungs every 6 (six) hours as needed for wheezing or shortness of breath., Disp: 18 g, Rfl: 0   cetirizine (ZYRTEC) 10 MG tablet, Take 1 tablet (10 mg total) by mouth daily., Disp: 90 tablet, Rfl: 3   cholecalciferol (D-VI-SOL) 10 MCG/ML LIQD, Vitamin D, Disp: , Rfl:    CLOBEX 0.05 % shampoo, Shampoo into scalp let sit 10 minutes then wash out. Use 2 times per week., Disp: 118 mL, Rfl: 11   ketoconazole (NIZORAL) 2 % shampoo, Shampoo into scalp let sit 10 minutes then wash out. Use twice per week., Disp: 120 mL, Rfl: 11   mometasone (ELOCON) 0.1 % lotion, Apply topically daily. Apply to scalp once a day at bedtime for 2 weeks, then decrease to 5 nights a week PRN flares, Disp: 60 mL, Rfl: 3   Omega-3 Fatty Acids (FISH OIL PO), Take by mouth., Disp: , Rfl:    Probiotic Product (PROBIOTIC DAILY PO), Take by mouth., Disp: , Rfl:    trimethoprim-polymyxin b (POLYTRIM) ophthalmic solution, Apply  1-2 drops into affected eye QID x 5 days., Disp: 10 mL, Rfl: 0   Medications ordered in this encounter:  Meds ordered this encounter  Medications   predniSONE (DELTASONE) 20 MG tablet    Sig: Take 1 tablet (20 mg total) by mouth daily with breakfast for 5 days.    Dispense:  5 tablet    Refill:  0    Supervising Provider:   Merrilee Jansky [[id number]]   fluticasone (FLONASE) 50 MCG/ACT nasal spray    Sig: Place 2 sprays into both nostrils daily.    Dispense:  16 g    Refill:  0    Supervising Provider:   Merrilee Jansky [[id number]]     *If you need refills on other medications prior to your next appointment, please contact your pharmacy*  Follow-Up: Call back or seek an in-person evaluation if the symptoms worsen or if the condition fails to improve as anticipated.  [facility] [phone]  Other Instructions INSTRUCTIONS: use a humidifier for nasal congestion Drink plenty of fluids, rest and wash hands frequently to avoid the spread of infection Alternate tylenol and Motrin for relief of fever    If you have been instructed to have an in-person evaluation today at a local Urgent Care facility, please use the link below. It will take you to a list of all of our available [facility], including address, phone number and hours of  operation. Please do not delay care.  [facility]  If you or a family member do not have a primary care provider, use the link below to schedule a visit and establish care. When you choose a [facility] primary care physician or advanced practice provider, you gain a long-term partner in health. Find a Primary Care Provider  Learn more about [facility] in-office and virtual care options:  - Get Care Now

## 2023-05-27 ENCOUNTER — Telehealth: Payer: Medicaid Other

## 2023-05-28 ENCOUNTER — Telehealth: Payer: Medicaid Other

## 2023-06-06 DIAGNOSIS — E0510 Thyrotoxicosis with toxic single thyroid nodule without thyrotoxic crisis or storm: Secondary | ICD-10-CM | POA: Diagnosis not present

## 2023-06-06 DIAGNOSIS — R519 Headache, unspecified: Secondary | ICD-10-CM | POA: Diagnosis not present

## 2023-06-06 DIAGNOSIS — Z1152 Encounter for screening for COVID-19: Secondary | ICD-10-CM | POA: Diagnosis not present

## 2023-06-06 DIAGNOSIS — J069 Acute upper respiratory infection, unspecified: Secondary | ICD-10-CM | POA: Diagnosis not present

## 2023-06-06 DIAGNOSIS — R5383 Other fatigue: Secondary | ICD-10-CM | POA: Diagnosis not present

## 2023-06-06 DIAGNOSIS — R051 Acute cough: Secondary | ICD-10-CM | POA: Diagnosis not present

## 2023-06-26 ENCOUNTER — Ambulatory Visit: Payer: Medicaid Other | Admitting: Dermatology

## 2023-07-03 ENCOUNTER — Ambulatory Visit: Payer: Medicaid Other | Admitting: Dermatology

## 2023-07-03 ENCOUNTER — Encounter: Payer: Self-pay | Admitting: Dermatology

## 2023-07-03 DIAGNOSIS — W908XXA Exposure to other nonionizing radiation, initial encounter: Secondary | ICD-10-CM | POA: Diagnosis not present

## 2023-07-03 DIAGNOSIS — Z1283 Encounter for screening for malignant neoplasm of skin: Secondary | ICD-10-CM | POA: Diagnosis not present

## 2023-07-03 DIAGNOSIS — L578 Other skin changes due to chronic exposure to nonionizing radiation: Secondary | ICD-10-CM | POA: Diagnosis not present

## 2023-07-03 DIAGNOSIS — L814 Other melanin hyperpigmentation: Secondary | ICD-10-CM | POA: Diagnosis not present

## 2023-07-03 DIAGNOSIS — R58 Hemorrhage, not elsewhere classified: Secondary | ICD-10-CM | POA: Diagnosis not present

## 2023-07-03 DIAGNOSIS — L409 Psoriasis, unspecified: Secondary | ICD-10-CM | POA: Diagnosis not present

## 2023-07-03 DIAGNOSIS — D229 Melanocytic nevi, unspecified: Secondary | ICD-10-CM

## 2023-07-03 DIAGNOSIS — W891XXA Exposure to tanning bed, initial encounter: Secondary | ICD-10-CM

## 2023-07-03 NOTE — Progress Notes (Addendum)
 Follow-Up Visit   Subjective  Julia Lyons is a 33 y.o. female who presents for the following: Skin Cancer Screening and Full Body Skin Exam. No personal or family hx of skin cancer or dysplastic nevi. Patient states she does use tanning beds.   Hx of psoriasis on scalp. Has used Ketoconazole  shampoo alternating with salicylic acid shampoo and clobetasol  shampoo in the past. Has used Mometasone  lotion in the past. States she has not had any issues or noticed any psoriasis in several months.   The patient presents for Total-Body Skin Exam (TBSE) for skin cancer screening and mole check. The patient has spots, moles and lesions to be evaluated, some may be new or changing and the patient may have concern these could be cancer.    The following portions of the chart were reviewed this encounter and updated as appropriate: medications, allergies, medical history  Review of Systems:  No other skin or systemic complaints except as noted in HPI or Assessment and Plan.  Objective  Well appearing patient in no apparent distress; mood and affect are within normal limits.  A full examination was performed including scalp, head, eyes, ears, nose, lips, neck, chest, axillae, abdomen, back, buttocks, bilateral upper extremities, bilateral lower extremities, hands, feet, fingers, toes, fingernails, and toenails. All findings within normal limits unless otherwise noted below.   Relevant physical exam findings are noted in the Assessment and Plan.  Exam of nails limited by presence of nail polish.    Assessment & Plan   SKIN CANCER SCREENING PERFORMED TODAY.  ACTINIC DAMAGE - Chronic condition, secondary to cumulative UV/sun exposure - diffuse scaly erythematous macules with underlying dyspigmentation - Recommend daily broad spectrum sunscreen SPF 30+ to sun-exposed areas, reapply every 2 hours as needed.  - Staying in the shade or wearing long sleeves, sun glasses (UVA+UVB protection)  and wide brim hats (4-inch brim around the entire circumference of the hat) are also recommended for sun protection.  - Call for new or changing lesions. - do not recommend tanning bed use due to increased risk of skin cancer  LENTIGINES - Benign normal skin lesions - Benign-appearing - Call for any changes  MELANOCYTIC NEVI - Tan-brown and/or pink-flesh-colored symmetric macules and papules - Benign appearing on exam today - Observation - Call clinic for new or changing moles - Recommend daily use of broad spectrum spf 30+ sunscreen to sun-exposed areas.  - Check nails when remove polish.  - Recommend avoiding tanning beds.    MELANOCYTIC NEVUS Exam: 0.3 cm flesh-colored symmetric papule   Treatment Plan: Benign-appearing. Stable compared to previous visit. Observation.  Call clinic for new or changing moles.  Recommend daily use of broad spectrum spf 30+ sunscreen to sun-exposed areas.   ecchymosis  Exam: large purple/green bruising at right lateral knee  Treatment: The patient will observe these symptoms, and report promptly any worsening or unexpected persistence.    PSORIASIS on scalp Exam: clear scalp 0% BSA.  Chronic condition with duration or expected duration over one year. Currently well-controlled.   patient denies joint pain  Psoriasis is a chronic non-curable, but treatable genetic/hereditary disease that may have other systemic features affecting other organ systems such as joints (Psoriatic Arthritis). It is associated with an increased risk of inflammatory bowel disease, heart disease, non-alcoholic fatty liver disease, and depression.  Treatments include light and laser treatments; topical medications; and systemic medications including oral and injectables.  Treatment Plan: Call for refills of Ketoconazole  shampoo, Clobetasol  shampoo and Mometasone  solution  if needed for flares.    Return in 1 year (on 07/02/2024) for TBSE .  I, Jill Parcell, CMA, am  acting as scribe for Boneta Sharps, MD.   Documentation: I have reviewed the above documentation for accuracy and completeness, and I agree with the above.  Boneta Sharps, MD

## 2023-07-03 NOTE — Patient Instructions (Signed)

## 2023-10-14 ENCOUNTER — Encounter (INDEPENDENT_AMBULATORY_CARE_PROVIDER_SITE_OTHER): Payer: Self-pay

## 2024-01-29 DIAGNOSIS — D511 Vitamin B12 deficiency anemia due to selective vitamin B12 malabsorption with proteinuria: Secondary | ICD-10-CM | POA: Diagnosis not present

## 2024-01-29 DIAGNOSIS — Z0001 Encounter for general adult medical examination with abnormal findings: Secondary | ICD-10-CM | POA: Diagnosis not present

## 2024-01-29 DIAGNOSIS — E039 Hypothyroidism, unspecified: Secondary | ICD-10-CM | POA: Diagnosis not present

## 2024-01-29 DIAGNOSIS — R799 Abnormal finding of blood chemistry, unspecified: Secondary | ICD-10-CM | POA: Diagnosis not present

## 2024-01-29 DIAGNOSIS — R5383 Other fatigue: Secondary | ICD-10-CM | POA: Diagnosis not present

## 2024-01-29 DIAGNOSIS — H6123 Impacted cerumen, bilateral: Secondary | ICD-10-CM | POA: Diagnosis not present

## 2024-01-29 DIAGNOSIS — R7303 Prediabetes: Secondary | ICD-10-CM | POA: Diagnosis not present

## 2024-01-29 DIAGNOSIS — E538 Deficiency of other specified B group vitamins: Secondary | ICD-10-CM | POA: Diagnosis not present

## 2024-01-29 DIAGNOSIS — E559 Vitamin D deficiency, unspecified: Secondary | ICD-10-CM | POA: Diagnosis not present

## 2024-01-29 DIAGNOSIS — E785 Hyperlipidemia, unspecified: Secondary | ICD-10-CM | POA: Diagnosis not present

## 2024-07-05 ENCOUNTER — Encounter: Payer: Medicaid Other | Admitting: Dermatology

## 2024-07-15 ENCOUNTER — Encounter: Admitting: Dermatology
